# Patient Record
Sex: Male | Born: 1975 | Race: White | Hispanic: No | Marital: Married | Smoking: Never smoker
Health system: Southern US, Community
[De-identification: ages and names within clinical notes are randomized; demographics above are authoritative.]

## PROBLEM LIST (undated history)

## (undated) DIAGNOSIS — R0789 Other chest pain: Secondary | ICD-10-CM

## (undated) DIAGNOSIS — R221 Localized swelling, mass and lump, neck: Secondary | ICD-10-CM

## (undated) DIAGNOSIS — N200 Calculus of kidney: Secondary | ICD-10-CM

## (undated) HISTORY — PX: KNEE ARTHROCENTESIS: SUR44

## (undated) HISTORY — PX: KNEE SURGERY: SHX244

## (undated) HISTORY — DX: Other chest pain: R07.89

## (undated) HISTORY — PX: HERNIA REPAIR: SHX51

## (undated) HISTORY — DX: Localized swelling, mass and lump, neck: R22.1

## (undated) HISTORY — DX: Calculus of kidney: N20.0

---

## 2004-08-12 ENCOUNTER — Encounter: Admission: RE | Admit: 2004-08-12 | Discharge: 2004-08-12 | Payer: Self-pay | Admitting: Family Medicine

## 2006-02-02 ENCOUNTER — Emergency Department (HOSPITAL_COMMUNITY): Admission: EM | Admit: 2006-02-02 | Discharge: 2006-02-03 | Payer: Self-pay | Admitting: Emergency Medicine

## 2008-06-07 ENCOUNTER — Ambulatory Visit: Payer: Self-pay | Admitting: Unknown Physician Specialty

## 2015-10-21 ENCOUNTER — Encounter: Payer: Self-pay | Admitting: Family Medicine

## 2015-10-21 ENCOUNTER — Ambulatory Visit
Admission: RE | Admit: 2015-10-21 | Discharge: 2015-10-21 | Disposition: A | Discharge status: Home / Self Care | Payer: BLUE CROSS/BLUE SHIELD | Source: Ambulatory Visit | Attending: Family Medicine | Admitting: Family Medicine

## 2015-10-21 ENCOUNTER — Ambulatory Visit (INDEPENDENT_AMBULATORY_CARE_PROVIDER_SITE_OTHER): Payer: BLUE CROSS/BLUE SHIELD | Admitting: Family Medicine

## 2015-10-21 VITALS — BP 116/78 | HR 74 | Temp 97.9°F | Resp 16 | Ht 74.0 in | Wt 211.2 lb

## 2015-10-21 DIAGNOSIS — R221 Localized swelling, mass and lump, neck: Secondary | ICD-10-CM | POA: Diagnosis not present

## 2015-10-21 DIAGNOSIS — R0789 Other chest pain: Secondary | ICD-10-CM | POA: Insufficient documentation

## 2015-10-21 DIAGNOSIS — K219 Gastro-esophageal reflux disease without esophagitis: Secondary | ICD-10-CM

## 2015-10-21 DIAGNOSIS — R09A2 Foreign body sensation, throat: Secondary | ICD-10-CM

## 2015-10-21 HISTORY — DX: Foreign body sensation, throat: R09.A2

## 2015-10-21 HISTORY — DX: Localized swelling, mass and lump, neck: R22.1

## 2015-10-21 HISTORY — DX: Other chest pain: R07.89

## 2015-10-21 MED ORDER — OMEPRAZOLE 20 MG PO CPDR: 20.0000 mg | DELAYED_RELEASE_CAPSULE | Freq: Every day | ORAL | Status: DC

## 2015-10-21 MED ORDER — ALBUTEROL SULFATE HFA 108 (90 BASE) MCG/ACT IN AERS: 2.0000 | INHALATION_SPRAY | Freq: Four times a day (QID) | RESPIRATORY_TRACT | Status: DC | PRN

## 2015-10-21 NOTE — Assessment & Plan Note (Signed)
Likely GERD induced bronchospasm or asthma type reaction. EKG is WNL. CXR is clear. Check CMET, Lipid, TSH.  Albuterol PRN. Alarm symptoms reviewed. Recheck in 2 weeks.

## 2015-10-21 NOTE — Patient Instructions (Signed)
I think your chest tightness is caused by a mixture of acid reflux and fumes at work. We will get a chest XR today to ensure nothing is causing your symptoms. Start an albuterol inhaler as needed for chest tightness and start taking omeprazole once daily to help with your symptoms.   Please seek immediate medical attention if you develop shortness of breath not relieve by inhaler, chest pain/tightness, fever > 103 F or other concerning symptoms.

## 2015-10-21 NOTE — Assessment & Plan Note (Signed)
Likely silent GERD. Trial of omeprazole once daily. Alarm symptoms reviewed. Consider ENT referral if not improving. Recheck 2 weeks.

## 2015-10-21 NOTE — Progress Notes (Signed)
Subjective:    Patient ID: Devon Lewis, male    DOB: 1975-11-09, 40 y.o.   MRN: [id number]  HPI: Devon Lewis is a 40 y.o. male presenting on 10/21/2015 for Adenopathy   HPI  Pt presents as new patient today for acute visit. Pt reporting chest tightness and lump sensation in the throat x1 week. Symptoms started last Monday. Sensation of lump in the throat. Chest tightness started on Thursday. No trouble breathing. Chest tightness is intermittent. The more active he is the worse the chest tightness occurs. Pt reports tightness feels like chest congestions but when he coughs to get it up nothing changes. No HA, no fevers. No recent illness. Pt is not a smoker. 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  Home treatment: Zyrtec did not help.   Previous provider [facility]. Records will be requested and reviewed.  No previous medical history.   No past medical history on file. Social History   Social History  . Marital Status: Married    Spouse Name: N/A  . Number of Children: N/A  . Years of Education: N/A   Occupational History  . Not on file.   Social History Main Topics  . Smoking status: Never Smoker   . Smokeless tobacco: Not on file  . Alcohol Use: No  . Drug Use: No  . Sexual Activity: Not on file   Other Topics Concern  . Not on file   Social History Narrative  . No narrative on file   No family history on file. No current outpatient prescriptions on file prior to visit.   No current facility-administered medications on file prior to visit.    Review of Systems  Constitutional: Negative for fever and chills.  HENT: Positive for trouble swallowing (sensation of lump in throat). Negative for congestion, ear discharge, facial swelling, postnasal drip and sinus pressure.   Respiratory: Positive for chest tightness. Negative for cough.   Cardiovascular: Negative for chest pain.  Gastrointestinal: Negative for nausea, vomiting, abdominal pain and  abdominal distention.  Musculoskeletal: Negative for myalgias, arthralgias, neck pain and neck stiffness.  Neurological: Negative for dizziness, numbness and headaches.   Per HPI unless specifically indicated above     Objective:    BP 116/78 mmHg  Pulse 74  Temp(Src) 97.9 F (36.6 C) (Oral)  Resp 16  Ht  (1.88 m)  Wt 211 lb 3.2 oz (95.8 kg)  BMI 27.11 kg/m2  Wt Readings from Last 3 Encounters:  10/21/15 211 lb 3.2 oz (95.8 kg)    Physical Exam  Constitutional: He is oriented to person, place, and time. He appears well-developed and well-nourished. No distress.  HENT:  Head: Normocephalic and atraumatic.  Right Ear: Hearing and tympanic membrane normal.  Left Ear: Hearing and tympanic membrane normal.  Nose: No mucosal edema or rhinorrhea. Right sinus exhibits no maxillary sinus tenderness and no frontal sinus tenderness. Left sinus exhibits no maxillary sinus tenderness and no frontal sinus tenderness.  Mouth/Throat: No uvula swelling. No posterior oropharyngeal edema or posterior oropharyngeal erythema.  Neck: Neck supple. No thyromegaly present.  Cardiovascular: Normal rate, regular rhythm and normal heart sounds.  Exam reveals no gallop and no friction rub.   No murmur heard. Pulmonary/Chest: Effort normal and breath sounds normal. He has no wheezes.  Abdominal: Soft. Bowel sounds are normal. He exhibits no distension. There is no tenderness. There is no rebound.  Musculoskeletal: Normal range of motion. He exhibits no edema or tenderness.  Neurological:  He is alert and oriented to person, place, and time. He has normal reflexes.  Skin: Skin is warm and dry. No rash noted. No erythema.  Psychiatric: He has a normal mood and affect. His behavior is normal. Thought content normal.   No results found for this or any previous visit.    Assessment & Plan:   Problem List Items Addressed This Visit      Other   Chest tightness - Primary    Likely GERD induced  bronchospasm or asthma type reaction. EKG is WNL. CXR is clear. Check CMET, Lipid, TSH.  Albuterol PRN. Alarm symptoms reviewed. Recheck in 2 weeks.       Relevant Medications   albuterol (PROVENTIL HFA;VENTOLIN HFA) 108 (90 Base) MCG/ACT inhaler   Other Relevant Orders   EKG 12-Lead   Lipid Profile   Comprehensive metabolic panel   TSH   DG Chest 2 View (Completed)   Sensation of lump in throat    Likely silent GERD. Trial of omeprazole once daily. Alarm symptoms reviewed. Consider ENT referral if not improving. Recheck 2 weeks.       Relevant Medications   omeprazole (PRILOSEC) 20 MG capsule   Other Relevant Orders   CBC with Differential/Platelet    Other Visit Diagnoses    Gastroesophageal reflux disease without esophagitis        Symptoms likely GERD related. Trial of PPI and recheck in 2 weeks.     Relevant Medications    omeprazole (PRILOSEC) 20 MG capsule    Other Relevant Orders    CBC with Differential/Platelet       Meds ordered this encounter  Medications  . albuterol (PROVENTIL HFA;VENTOLIN HFA) 108 (90 Base) MCG/ACT inhaler    Sig: Inhale 2 puffs into the lungs every 6 (six) hours as needed for wheezing or shortness of breath.    Dispense:  1 Inhaler    Refill:  0    Order Specific Question:  Supervising Provider    Answer:  Janeann Forehand [[id number]]  . omeprazole (PRILOSEC) 20 MG capsule    Sig: Take 1 capsule (20 mg total) by mouth daily.    Dispense:  30 capsule    Refill:  3    Order Specific Question:  Supervising Provider    Answer:  Janeann Forehand [[id number]]      Follow up plan: Return in about 2 weeks (around 11/04/2015).

## 2015-10-31 LAB — CBC WITH DIFFERENTIAL/PLATELET
Basophils Absolute: 0 10*3/uL (ref 0.0–0.2)
Basos: 1 %
EOS (ABSOLUTE): 0.2 10*3/uL (ref 0.0–0.4)
Eos: 2 %
Hematocrit: 46 % (ref 37.5–51.0)
Hemoglobin: 15.8 g/dL (ref 12.6–17.7)
Immature Grans (Abs): 0 10*3/uL (ref 0.0–0.1)
Immature Granulocytes: 0 %
Lymphocytes Absolute: 2.4 10*3/uL (ref 0.7–3.1)
Lymphs: 33 %
MCH: 29.9 pg (ref 26.6–33.0)
MCHC: 34.3 g/dL (ref 31.5–35.7)
MCV: 87 fL (ref 79–97)
Monocytes Absolute: 0.5 10*3/uL (ref 0.1–0.9)
Monocytes: 7 %
Neutrophils Absolute: 4.3 10*3/uL (ref 1.4–7.0)
Neutrophils: 57 %
Platelets: 254 10*3/uL (ref 150–379)
RBC: 5.28 x10E6/uL (ref 4.14–5.80)
RDW: 13 % (ref 12.3–15.4)
WBC: 7.4 10*3/uL (ref 3.4–10.8)

## 2015-10-31 LAB — COMPREHENSIVE METABOLIC PANEL
ALT: 18 IU/L (ref 0–44)
AST: 21 IU/L (ref 0–40)
Albumin/Globulin Ratio: 1.8 (ref 1.1–2.5)
Albumin: 4.6 g/dL (ref 3.5–5.5)
Alkaline Phosphatase: 70 IU/L (ref 39–117)
BUN/Creatinine Ratio: 15 (ref 8–19)
BUN: 17 mg/dL (ref 6–20)
Bilirubin Total: 0.4 mg/dL (ref 0.0–1.2)
CO2: 25 mmol/L (ref 18–29)
CREATININE: 1.14 mg/dL (ref 0.76–1.27)
Calcium: 9.3 mg/dL (ref 8.7–10.2)
Chloride: 100 mmol/L (ref 96–106)
GFR calc Af Amer: 93 mL/min/{1.73_m2} (ref >59)
GFR calc non Af Amer: 81 mL/min/{1.73_m2} (ref >59)
Globulin, Total: 2.5 g/dL (ref 1.5–4.5)
Glucose: 74 mg/dL (ref 65–99)
Potassium: 4.2 mmol/L (ref 3.5–5.2)
Sodium: 142 mmol/L (ref 134–144)
Total Protein: 7.1 g/dL (ref 6.0–8.5)

## 2015-10-31 LAB — LIPID PANEL
CHOL/HDL RATIO: 4.6 ratio (ref 0.0–5.0)
CHOLESTEROL TOTAL: 198 mg/dL (ref 100–199)
HDL: 43 mg/dL (ref >39)
LDL Calculated: 111 mg/dL — ABNORMAL HIGH (ref 0–99)
Triglycerides: 219 mg/dL — ABNORMAL HIGH (ref 0–149)
VLDL Cholesterol Cal: 44 mg/dL — ABNORMAL HIGH (ref 5–40)

## 2015-10-31 LAB — TSH: TSH: 2.21 u[IU]/mL (ref 0.450–4.500)

## 2015-11-05 ENCOUNTER — Ambulatory Visit (INDEPENDENT_AMBULATORY_CARE_PROVIDER_SITE_OTHER): Payer: BLUE CROSS/BLUE SHIELD | Admitting: Family Medicine

## 2015-11-05 ENCOUNTER — Encounter: Payer: Self-pay | Admitting: Family Medicine

## 2015-11-05 VITALS — BP 119/68 | HR 72 | Temp 98.7°F | Resp 16 | Ht 74.0 in | Wt 210.0 lb

## 2015-11-05 DIAGNOSIS — R0789 Other chest pain: Secondary | ICD-10-CM | POA: Diagnosis not present

## 2015-11-05 DIAGNOSIS — E7800 Pure hypercholesterolemia, unspecified: Secondary | ICD-10-CM | POA: Diagnosis not present

## 2015-11-05 DIAGNOSIS — R221 Localized swelling, mass and lump, neck: Secondary | ICD-10-CM

## 2015-11-05 DIAGNOSIS — J40 Bronchitis, not specified as acute or chronic: Secondary | ICD-10-CM | POA: Diagnosis not present

## 2015-11-05 MED ORDER — DM-GUAIFENESIN ER 30-600 MG PO TB12: 1.0000 | ORAL_TABLET | Freq: Two times a day (BID) | ORAL | Status: DC

## 2015-11-05 MED ORDER — BENZONATATE 100 MG PO CAPS: 100.0000 mg | ORAL_CAPSULE | Freq: Three times a day (TID) | ORAL | Status: DC | PRN

## 2015-11-05 NOTE — Patient Instructions (Addendum)
Your symptoms are consistent with a viral upper respiratory infection. At this time there is no need for antibiotics.  If your symptoms persist for > 10 days or get better and than worsen please let me know. You may have a secondary bacterial infection.  You can use supportive care at home to help with your symptoms. I have sent Mucinex DM to your pharmacy to help break up the congestion and soothe your cough. You can takes this twice daily.  I have also sent tesslon perles to your pharmacy to help with the cough- you can take these 3 times daily as needed. Honey is a natural cough suppressant- so add it to your tea in the morning.  If you have a humidifer, set that up in your bedroom at night.   Please seek immediate medical attention if you develop shortness of breath not relieve by inhaler, chest pain/tightness, fever > 103 F or other concerning symptoms.   

## 2015-11-05 NOTE — Assessment & Plan Note (Signed)
Reviewed diet and lifestyle changes that can improve lipid profile. Recheck 6 mos.

## 2015-11-05 NOTE — Progress Notes (Signed)
Subjective:    Patient ID: Devon Lewis, male    DOB: Feb 14, 1976, 40 y.o.   MRN: [id number]  HPI: Devon Lewis is a 40 y.o. male presenting on 11/05/2015 for Chest Pain   HPI  Pt presents for follow-up of chest tightness. Symptoms had resolved with omeprazole and albuterol inhaler.  Doing well until his daughter gave him a URI starting 3 days ago.Pt is reporting scratchy throat and dry cough. Mild chest tightness that he thinks is related to the URI- relieved by inhaler. No shortness of breath.  No fevers.  No nasal congestion. Rhinorrhea. No ear pain.    No past medical history on file.  Current Outpatient Prescriptions on File Prior to Visit  Medication Sig  . albuterol (PROVENTIL HFA;VENTOLIN HFA) 108 (90 Base) MCG/ACT inhaler Inhale 2 puffs into the lungs every 6 (six) hours as needed for wheezing or shortness of breath.  Marland Kitchen omeprazole (PRILOSEC) 20 MG capsule Take 1 capsule (20 mg total) by mouth daily.   No current facility-administered medications on file prior to visit.    Review of Systems  Constitutional: Negative for fever and chills.  HENT: Positive for congestion, postnasal drip, rhinorrhea and sore throat. Negative for ear pain, sinus pressure, trouble swallowing and voice change.   Respiratory: Positive for cough and chest tightness. Negative for shortness of breath and wheezing.   Cardiovascular: Negative for chest pain, palpitations and leg swelling.  Gastrointestinal: Negative for nausea, vomiting and abdominal pain.  Musculoskeletal: Negative for neck pain and neck stiffness.   Per HPI unless specifically indicated above     Objective:    BP 119/68 mmHg  Pulse 72  Temp(Src) 98.7 F (37.1 C) (Oral)  Resp 16  Ht  (1.88 m)  Wt 210 lb (95.255 kg)  BMI 26.95 kg/m2  SpO2 96%  Wt Readings from Last 3 Encounters:  11/05/15 210 lb (95.255 kg)  10/21/15 211 lb 3.2 oz (95.8 kg)    Physical Exam  Constitutional: He appears well-developed and  well-nourished. No distress.  HENT:  Head: Normocephalic and atraumatic.  Right Ear: Hearing and tympanic membrane normal. Tympanic membrane is not erythematous and not bulging.  Left Ear: Hearing and tympanic membrane normal. Tympanic membrane is not erythematous and not bulging.  Nose: Mucosal edema and rhinorrhea present. No sinus tenderness or nasal septal hematoma. Right sinus exhibits no maxillary sinus tenderness and no frontal sinus tenderness. Left sinus exhibits no maxillary sinus tenderness and no frontal sinus tenderness.  Mouth/Throat: Uvula is midline and mucous membranes are normal. No uvula swelling. Posterior oropharyngeal erythema present. No posterior oropharyngeal edema.  Neck: Neck supple. No Brudzinski's sign and no Kernig's sign noted.  Cardiovascular: Normal rate, regular rhythm and normal heart sounds.   Pulmonary/Chest: Breath sounds normal. No accessory muscle usage. No tachypnea. No respiratory distress. He has no decreased breath sounds. He has no wheezes. He has no rhonchi. He has no rales. Chest wall is not dull to percussion. He exhibits no tenderness.  Lymphadenopathy:    He has no cervical adenopathy.   Results for orders placed or performed in visit on 10/21/15  Lipid Profile  Result Value Ref Range   Cholesterol, Total 198 100 - 199 mg/dL   Triglycerides 409 (H) 0 - 149 mg/dL   HDL 43 >81 mg/dL   VLDL Cholesterol Cal 44 (H) 5 - 40 mg/dL   LDL Calculated 191 (H) 0 - 99 mg/dL   Chol/HDL Ratio 4.6 0.0 - 5.0 ratio units  Comprehensive metabolic panel  Result Value Ref Range   Glucose 74 65 - 99 mg/dL   BUN 17 6 - 20 mg/dL   Creatinine, Ser 1.61 0.76 - 1.27 mg/dL   GFR calc non Af Amer 81 >59 mL/min/1.73   GFR calc Af Amer 93 >59 mL/min/1.73   BUN/Creatinine Ratio 15 8 - 19   Sodium 142 134 - 144 mmol/L   Potassium 4.2 3.5 - 5.2 mmol/L   Chloride 100 96 - 106 mmol/L   CO2 25 18 - 29 mmol/L   Calcium 9.3 8.7 - 10.2 mg/dL   Total Protein 7.1 6.0 - 8.5  g/dL   Albumin 4.6 3.5 - 5.5 g/dL   Globulin, Total 2.5 1.5 - 4.5 g/dL   Albumin/Globulin Ratio 1.8 1.1 - 2.5   Bilirubin Total 0.4 0.0 - 1.2 mg/dL   Alkaline Phosphatase 70 39 - 117 IU/L   AST 21 0 - 40 IU/L   ALT 18 0 - 44 IU/L  TSH  Result Value Ref Range   TSH 2.210 0.450 - 4.500 uIU/mL  CBC with Differential/Platelet  Result Value Ref Range   WBC 7.4 3.4 - 10.8 x10E3/uL   RBC 5.28 4.14 - 5.80 x10E6/uL   Hemoglobin 15.8 12.6 - 17.7 g/dL   Hematocrit 09.6 04.5 - 51.0 %   MCV 87 79 - 97 fL   MCH 29.9 26.6 - 33.0 pg   MCHC 34.3 31.5 - 35.7 g/dL   RDW 40.9 81.1 - 91.4 %   Platelets 254 150 - 379 x10E3/uL   Neutrophils 57 %   Lymphs 33 %   Monocytes 7 %   Eos 2 %   Basos 1 %   Neutrophils Absolute 4.3 1.4 - 7.0 x10E3/uL   Lymphocytes Absolute 2.4 0.7 - 3.1 x10E3/uL   Monocytes Absolute 0.5 0.1 - 0.9 x10E3/uL   EOS (ABSOLUTE) 0.2 0.0 - 0.4 x10E3/uL   Basophils Absolute 0.0 0.0 - 0.2 x10E3/uL   Immature Granulocytes 0 %   Immature Grans (Abs) 0.0 0.0 - 0.1 x10E3/uL      Assessment & Plan:   Problem List Items Addressed This Visit      Other   Chest tightness - Primary    Resolved completely until recent URI. Symptoms feel different. Likely 2/2 bronchitis. Encouraged continued use of inhaler. Alarm symptoms reviewed. Return if not improving.       Sensation of lump in throat    Resolved with omeprazole.       Mild hypercholesterolemia    Reviewed diet and lifestyle changes that can improve lipid profile. Recheck 6 mos.        Other Visit Diagnoses    Bronchitis        Likely 2/2 virus. Abx indication reviewed. Supportive care at home. Alarm symptoms reviewed. Return if not improving.     Relevant Medications    dextromethorphan-guaiFENesin (MUCINEX DM) 30-600 MG 12hr tablet    benzonatate (TESSALON) 100 MG capsule       Meds ordered this encounter  Medications  . dextromethorphan-guaiFENesin (MUCINEX DM) 30-600 MG 12hr tablet    Sig: Take 1 tablet by mouth  2 (two) times daily.    Dispense:  20 tablet    Refill:  0    Order Specific Question:  Supervising Provider    Answer:  Janeann Forehand [phone]  . benzonatate (TESSALON) 100 MG capsule    Sig: Take 1 capsule (100 mg total) by mouth 3 (three) times daily as needed.  Dispense:  30 capsule    Refill:  0    Order Specific Question:  Supervising Provider    Answer:  Arlis Porta [id number]      Follow up plan: Return if symptoms worsen or fail to improve.

## 2015-11-05 NOTE — Assessment & Plan Note (Signed)
Resolved with omeprazole.  °

## 2015-11-05 NOTE — Assessment & Plan Note (Signed)
Resolved completely until recent URI. Symptoms feel different. Likely 2/2 bronchitis. Encouraged continued use of inhaler. Alarm symptoms reviewed. Return if not improving.

## 2017-12-19 ENCOUNTER — Ambulatory Visit: Payer: 59 | Admitting: Nurse Practitioner

## 2017-12-19 ENCOUNTER — Encounter: Payer: Self-pay | Admitting: Nurse Practitioner

## 2017-12-19 ENCOUNTER — Other Ambulatory Visit: Payer: Self-pay

## 2017-12-19 VITALS — BP 121/73 | HR 71 | Temp 98.4°F | Ht 74.0 in | Wt 221.6 lb

## 2017-12-19 DIAGNOSIS — K648 Other hemorrhoids: Secondary | ICD-10-CM

## 2017-12-19 MED ORDER — HYDROCORTISONE ACETATE 25 MG RE SUPP: 25.0000 mg | Freq: Two times a day (BID) | RECTAL | 1 refills | Status: DC

## 2017-12-19 NOTE — Progress Notes (Signed)
Subjective:    Patient ID: Devon Lewis, male    DOB: 04/05/1976, 42 y.o.   MRN: [id number]  Devon Kirkshomas N [place]ovan[place] is a 42 y.o. male presenting on 12/19/2017 for Hemorrhoids (itching, irritation x 5 weeks. He's been treating it with OTC medicatin. Pt had treatment x 10 yrs ago. )   HPI Hemorrhoids Itching and irritation for last 5 weeks.  Pt has had treatment of hemorrhoids with injections over 10 years ago.  Since most recent flare, he has been using OTC Preparation H without relief.  - No enlargement, no bleeding, but has persistent itching and irritation that interrupts sleep. - Takes fiber supplement for prevention of constipation. He has soft, formed BM every 1-3 days.  Usually every 1-2 days.   - Pt notes itching and irritation symptoms come and go over the last 10 years.  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  Social History   Tobacco Use  . Smoking status: Never Smoker  . Smokeless tobacco: Never Used  Substance Use Topics  . Alcohol use: No  . Drug use: No    Review of Systems Per HPI unless specifically indicated above     Objective:    BP 121/73 (BP Location: Right Arm, Patient Position: Sitting, Cuff Size: Normal)   Pulse 71   Temp 98.4 F (36.9 C) (Oral)   Ht 6\' 2"  (1.88 m)   Wt 221 lb 9.6 oz (100.5 kg)   BMI 28.45 kg/m   Wt Readings from Last 3 Encounters:  12/19/17 221 lb 9.6 oz (100.5 kg)  11/05/15 210 lb (95.3 kg)  10/21/15 211 lb 3.2 oz (95.8 kg)    Physical Exam  Constitutional: He is oriented to person, place, and time. He appears well-developed and well-nourished. No distress.  HENT:  Head: Normocephalic and atraumatic.  Cardiovascular: Normal rate, regular rhythm, S1 normal, S2 normal, normal heart sounds and intact distal pulses.  Pulmonary/Chest: Effort normal and  breath sounds normal. No respiratory distress.  Abdominal: Soft. Bowel sounds are normal. He exhibits no distension. There is no hepatosplenomegaly. There is no tenderness. No hernia.  Genitourinary: Prostate normal. Rectal exam shows internal hemorrhoid (internal hemorrhoids noted at 12 and 3 o'clock - grade 3 without prolapse. not thrombosed.) and anal tone abnormal (hypertonic). Rectal exam shows no external hemorrhoid, no fissure, no mass and guaiac negative stool.  Neurological: He is alert and oriented to person, place, and time.  Skin: Skin is warm and dry.  Psychiatric: He has a normal mood and affect. His behavior is normal.  Vitals reviewed.    Results for orders placed or performed in visit on 10/21/15  Lipid Profile  Result Value Ref Range   Cholesterol, Total 198 100 - 199 mg/dL   Triglycerides 409219 (H) 0 - 149 mg/dL   HDL 43 >81>39 mg/dL   VLDL Cholesterol Cal 44 (H) 5 - 40 mg/dL   LDL Calculated 191111 (H) 0 - 99 mg/dL   Chol/HDL Ratio 4.6 0.0 - 5.0 ratio units  Comprehensive metabolic panel  Result Value Ref Range   Glucose 74 65 - 99 mg/dL   BUN 17 6 - 20 mg/dL   Creatinine, Ser 4.781.14 0.76 - 1.27 mg/dL   GFR calc non Af Amer 81 >59 mL/min/1.73   GFR calc Af Amer 93 >59 mL/min/1.73   BUN/Creatinine  Ratio 15 8 - 19   Sodium 142 134 - 144 mmol/L   Potassium 4.2 3.5 - 5.2 mmol/L   Chloride 100 96 - 106 mmol/L   CO2 25 18 - 29 mmol/L   Calcium 9.3 8.7 - 10.2 mg/dL   Total Protein 7.1 6.0 - 8.5 g/dL   Albumin 4.6 3.5 - 5.5 g/dL   Globulin, Total 2.5 1.5 - 4.5 g/dL   Albumin/Globulin Ratio 1.8 1.1 - 2.5   Bilirubin Total 0.4 0.0 - 1.2 mg/dL   Alkaline Phosphatase 70 39 - 117 IU/L   AST 21 0 - 40 IU/L   ALT 18 0 - 44 IU/L  TSH  Result Value Ref Range   TSH 2.210 0.450 - 4.500 uIU/mL  CBC with Differential/Platelet  Result Value Ref Range   WBC 7.4 3.4 - 10.8 x10E3/uL   RBC 5.28 4.14 - 5.80 x10E6/uL   Hemoglobin 15.8 12.6 - 17.7 g/dL   Hematocrit 16.1 09.6 - 51.0 %    MCV 87 79 - 97 fL   MCH 29.9 26.6 - 33.0 pg   MCHC 34.3 31.5 - 35.7 g/dL   RDW 04.5 40.9 - 81.1 %   Platelets 254 150 - 379 x10E3/uL   Neutrophils 57 %   Lymphs 33 %   Monocytes 7 %   Eos 2 %   Basos 1 %   Neutrophils Absolute 4.3 1.4 - 7.0 x10E3/uL   Lymphocytes Absolute 2.4 0.7 - 3.1 x10E3/uL   Monocytes Absolute 0.5 0.1 - 0.9 x10E3/uL   EOS (ABSOLUTE) 0.2 0.0 - 0.4 x10E3/uL   Basophils Absolute 0.0 0.0 - 0.2 x10E3/uL   Immature Granulocytes 0 %   Immature Grans (Abs) 0.0 0.0 - 0.1 x10E3/uL      Assessment & Plan:   Problem List Items Addressed This Visit    None    Visit Diagnoses    Internal hemorrhoids    -  Primary   Relevant Medications   hydrocortisone (ANUSOL-HC) 25 MG suppository   Other Relevant Orders   Ambulatory referral to Gastroenterology     Subacute inflamed internal hemorrhoids noted at 12 and 3 o'clock on DRE.  Irritation and itching are primary symptoms that are not relieved with conservative and OTC therapies.  Prior treatment of internal hemorrhoids has included steroid injection 10 years ago.   Plan: 1. Place suppository twice daily x 6 days. 2. Encouraged pt to practice pelvic floor relaxation exercises.  Consider pelvic floor physical therapy. 3. Referral to GI for consultation about possible internal hemorrhoid ligation. 4. Continue management of constipation with fiber supplement and drinking plenty of water. 5. Followup as needed in 2-4 weeks and for annual physical in next 6 months.   Meds ordered this encounter  Medications  . hydrocortisone (ANUSOL-HC) 25 MG suppository    Sig: Place 1 suppository (25 mg total) rectally 2 (two) times daily.    Dispense:  12 suppository    Refill:  1    Order Specific Question:   Supervising Provider    Answer:   Smitty Cords [[id number]]    Follow up plan: Return 2-4 weeks if symptoms worsen or fail to improve.  Wilhelmina Mcardle, DNP, AGPCNP-BC Adult Gerontology Primary Care Nurse  Practitioner [facility] 12/19/2017, 2:00 PM

## 2017-12-19 NOTE — Patient Instructions (Addendum)
Charleen Kirkshomas N [place],   Thank you for coming in to clinic today.  1. Work to relax pelvic floor tone with exercises below.  Think reverse of strengthening to stop urination or stop a bowel movement.  2. Place an anusol suppository twice daily for 6 days.  3. [facility] referral is placed.  You can cancel this if you don't need it.     Please schedule a follow-up appointment with Wilhelmina McardleLauren Huxley Shurley, AGNP. Return 2-4 weeks if symptoms worsen or fail to improve.  If you have any other questions or concerns, please feel free to call the clinic or send a message through MyChart. You may also schedule an earlier appointment if necessary.  You will receive a survey after today's visit either digitally by e-mail or paper by [employer] mail. Your experiences and feedback matter to us.  Please respond so we know how we are doing as we provide care for you.   Wilhelmina McardleLauren Annelyse Rey, DNP, AGNP-BC Adult Gerontology Nurse Practitioner [facility], [facility]  The Male Pelvic Floor Muscles  The pelvic floor consists of several layers of muscles that cover the bottom of the pelvic cavity. These muscles have several distinct roles:  1. To support the pelvic organs, the bladder and colon within the pelvis. 2. To assist in stopping and starting the flow of urine or the passage of gas or stool. 3. To aid in sexual appreciation.    How to Locate the Pelvic Floor Muscles  The Urine Stop Test . At the midstream of your urine flow, squeeze the pelvic floor muscles. You should feel the sensation of the openings close and the muscles pulling the penis and anus up and in to the pelvic cavity.  If you have strong muscles you will slow or stop the stream of urine. . Try to stop or slow the flow of urine without tensing the muscles of your legs, buttocks. . Do this only to locate the muscles, not as a daily exercise. Feeling the Muscle . Place a fingertip on or into the rectal opening.  Contract and lift the muscles as  though you were holding back gas or a bowel movement.   . You will feel your anal opening tighten and your penis move slightly. Watching the Muscles Contract . Begin by lying on a flat surface.  Position yourself with your knees apart and bent with your head elevated and supported on several pillows.  Use a mirror to look at the anal opening and penis.  . Contract or tighten the muscles around the anal opening and watch for a puckering and lifting of the anus and slight movement of the penis.   . If you see a bulge of your anus this is an incorrect contraction and you should notify your health care provider for more instructions.   2007, [employer] Doc.12

## 2017-12-30 ENCOUNTER — Ambulatory Visit: Payer: 59 | Admitting: Gastroenterology

## 2017-12-30 ENCOUNTER — Encounter: Payer: Self-pay | Admitting: Gastroenterology

## 2017-12-30 VITALS — BP 115/76 | HR 86 | Ht 74.0 in | Wt 223.4 lb

## 2017-12-30 DIAGNOSIS — K640 First degree hemorrhoids: Secondary | ICD-10-CM

## 2017-12-30 NOTE — Progress Notes (Signed)
Arlyss Repress, MD [address]  [address]  [place], [place] [address]  Main: [phone]  Fax: [phone]    Gastroenterology Consultation  Referring Provider:     Galen Manila, * Primary Care Physician:  Galen Manila, NP Primary Gastroenterologist:  Dr. Arlyss Repress Reason for Consultation:     Symptomatic hemorrhoids        HPI:   Devon Lewis is a 42 y.o. male referred by Dr. Kyung Rudd, Alison Stalling, NP  for consultation & management of symptomatic hemorrhoids. He has no past medical history, presents with long-term history of symptoms related to hemorrhoids including perianal itching and irritation. He reports that he had some injection to the hemorrhoids about 10 years ago which provided complete relief for 2 years. He has been having intermittent symptoms which are self-limiting. However, for the last 2 months he has been experiencing intense perianal itching and irritation. He used Anusol suppository for about 6 days which provided some relief. For the last 1 week, he has recurrence of the symptoms. He does use Preparation H which provides temporary relief. He denies swelling, burning, rectal discomfort, bleeding or prolapse. He denies constipation or diarrhea. He takes fiber gummy daily  NSAIDs: none  Antiplts/Anticoagulants/Anti thrombotics: none  GI Procedures: none He denies family history of GI malignancy  History reviewed. No pertinent past medical history.  Past Surgical History:  Procedure Laterality Date  . HERNIA REPAIR    . KNEE ARTHROCENTESIS      Prior to Admission medications   Medication Sig Start Date End Date Taking? Authorizing Provider  [facility] [employer] [facility] PO Take by mouth.   Yes [provider]  hydrocortisone (ANUSOL-HC) 25 MG suppository Place 1 suppository (25 mg total) rectally 2 (two) times daily. 12/19/17  Yes Galen Manila, NP    History reviewed. No pertinent family history.    Social History   Tobacco Use  . Smoking status: Never Smoker  . Smokeless tobacco: Never Used  Substance Use Topics  . Alcohol use: No  . Drug use: No    Allergies as of 12/30/2017  . (No Known Allergies)    Review of Systems:    All systems reviewed and negative except where noted in HPI.   Physical Exam:  BP 115/76   Pulse 86   Ht 6\' 2"  (1.88 m)   Wt 223 lb 6.4 oz (101.3 kg)   BMI 28.68 kg/m  No LMP for male patient.  General:   Alert,  Well-developed, well-nourished, pleasant and cooperative in NAD Head:  Normocephalic and atraumatic. Eyes:  Sclera clear, no icterus.   Conjunctiva pink. Ears:  Normal auditory acuity. Nose:  No deformity, discharge, or lesions. Mouth:  No deformity or lesions,oropharynx pink & moist. Neck:  Supple; no masses or thyromegaly. Lungs:  Respirations even and unlabored.  Clear throughout to auscultation.   No wheezes, crackles, or rhonchi. No acute distress. Heart:  Regular rate and rhythm; no murmurs, clicks, rubs, or gallops. Abdomen:  Normal bowel sounds. Soft, non-tender and non-distended without masses, hepatosplenomegaly or hernias noted.  No guarding or rebound tenderness.   Rectal: anal spasm, internal hemorrhoids. There were no perianal lesions or rash Msk:  Symmetrical without gross deformities. Good, equal movement & strength bilaterally. Pulses:  Normal pulses noted. Extremities:  No clubbing or edema.  No cyanosis. Neurologic:  Alert and oriented x3;  grossly normal neurologically. Skin:  Intact without significant lesions or rashes. No jaundice. Psych:  Alert and cooperative. Normal mood and affect.  Imaging Studies: CT in 06/2008 unremarkable  Assessment and Plan:   Charleen Kirkshomas N Rinkenberger is a 42 y.o. Caucasian male with chronic history of symptomatic hemorrhoids. Symptoms including perianal itching and irritation. I discussed with him in about outpatient hemorrhoid ligation and patient is agreeable for the procedure. Consent  obtained  RA hemorrhoid ligation was performed today   Follow up in 2 weeks   Arlyss Repressohini R Ashlay Altieri, MD

## 2018-01-12 ENCOUNTER — Ambulatory Visit: Payer: 59 | Admitting: Gastroenterology

## 2018-01-12 ENCOUNTER — Encounter: Payer: Self-pay | Admitting: Gastroenterology

## 2018-01-12 VITALS — BP 111/69 | HR 70 | Resp 16 | Ht 74.0 in | Wt 223.8 lb

## 2018-01-12 DIAGNOSIS — K640 First degree hemorrhoids: Secondary | ICD-10-CM

## 2018-01-12 NOTE — Progress Notes (Signed)
PROCEDURE NOTE: The patient presents with symptomatic grade 1 hemorrhoids, unresponsive to maximal medical therapy, requesting rubber band ligation of his/her hemorrhoidal disease.  All risks, benefits and alternative forms of therapy were described and informed consent was obtained.  The decision was made to band the RP internal hemorrhoid, and the [facility] O'Regan System was used to perform band ligation without complication.  Digital anorectal examination was then performed to assure proper positioning of the band, and to adjust the banded tissue as required.  The patient was discharged home without pain or other issues.  Dietary and behavioral recommendations were given and (if necessary - prescriptions were given), along with follow-up instructions.  The patient will return 2 weeks for follow-up and possible additional banding as required.  No complications were encountered and the patient tolerated the procedure well.  Mikeala Girdler R Shamell Suarez, MD [address]  [address]  [place], [place] [address]  Main: [phone]  Fax: [phone] Pager: [phone]  

## 2018-02-01 ENCOUNTER — Encounter: Payer: Self-pay | Admitting: Gastroenterology

## 2018-02-01 ENCOUNTER — Ambulatory Visit: Payer: 59 | Admitting: Gastroenterology

## 2018-02-01 VITALS — BP 108/71 | HR 71 | Resp 17 | Ht 74.0 in | Wt 220.8 lb

## 2018-02-01 DIAGNOSIS — K640 First degree hemorrhoids: Secondary | ICD-10-CM | POA: Diagnosis not present

## 2018-02-01 NOTE — Progress Notes (Signed)
PROCEDURE NOTE: The patient presents with symptomatic grade 1 hemorrhoids, unresponsive to maximal medical therapy, requesting rubber band ligation of his/her hemorrhoidal disease.  All risks, benefits and alternative forms of therapy were described and informed consent was obtained.    The decision was made to band the LL internal hemorrhoid, and the [facility] O'Regan System was used to perform band ligation without complication.  Digital anorectal examination was then performed to assure proper positioning of the band, and to adjust the banded tissue as required.  The patient was discharged home without pain or other issues.  Dietary and behavioral recommendations were given and (if necessary - prescriptions were given), along with follow-up instructions.  The patient will return 2 weeks for follow-up and possible additional banding as required.  No complications were encountered and the patient tolerated the procedure well.  Rohini R Vanga, MD [address]  [address]  [place], [place] [address]  Main: [phone]  Fax: [phone] Pager: [phone]    

## 2018-02-27 DIAGNOSIS — M9903 Segmental and somatic dysfunction of lumbar region: Secondary | ICD-10-CM | POA: Diagnosis not present

## 2018-02-27 DIAGNOSIS — M5033 Other cervical disc degeneration, cervicothoracic region: Secondary | ICD-10-CM | POA: Diagnosis not present

## 2018-02-27 DIAGNOSIS — M9901 Segmental and somatic dysfunction of cervical region: Secondary | ICD-10-CM | POA: Diagnosis not present

## 2018-02-28 ENCOUNTER — Ambulatory Visit: Payer: 59 | Admitting: Nurse Practitioner

## 2018-02-28 ENCOUNTER — Ambulatory Visit
Admission: RE | Admit: 2018-02-28 | Discharge: 2018-02-28 | Disposition: A | Discharge status: Home / Self Care | Payer: 59 | Source: Ambulatory Visit | Attending: Nurse Practitioner | Admitting: Nurse Practitioner

## 2018-02-28 ENCOUNTER — Encounter: Payer: Self-pay | Admitting: Nurse Practitioner

## 2018-02-28 ENCOUNTER — Other Ambulatory Visit: Payer: Self-pay

## 2018-02-28 VITALS — BP 114/66 | Temp 98.8°F | Ht 74.0 in | Wt 219.6 lb

## 2018-02-28 DIAGNOSIS — M79671 Pain in right foot: Secondary | ICD-10-CM

## 2018-02-28 MED ORDER — DICLOFENAC SODIUM 75 MG PO TBEC: 75.0000 mg | DELAYED_RELEASE_TABLET | Freq: Two times a day (BID) | ORAL | 0 refills | Status: AC

## 2018-02-28 NOTE — Patient Instructions (Addendum)
Charleen Kirkshomas N Rochford,   Thank you for coming in to clinic today.  1. You have foot pain, likely from overuse. - May consider changing to new shoes, be fitted for orthotics. Can make referral to podiatry if these  - Start taking Tylenol extra strength 1 to 2 tablets every 6-8 hours for aches or fever/chills for next few days as needed.  Do not take more than 3,000 mg in 24 hours from all medicines.   - START diclofenac 75 mg twice daily for 14 days. - AFTER diclofenac, may resume ibuprofen 400-600 mg three times daily as needed. - Use heat and ice.  Apply this for 15 minutes at a time 6-8 times per day.   - Muscle rub with lidocaine, lidocaine patch, Biofreeze, or tiger balm for topical pain relief.  Avoid using this with heat and ice to avoid burns.  Please schedule a follow-up appointment with Wilhelmina McardleLauren Friend Dorfman, AGNP. Call if symptoms worsen or fail to improve 2-3 weeks for podiatry referral.  If you have any other questions or concerns, please feel free to call the clinic or send a message through MyChart. You may also schedule an earlier appointment if necessary.  You will receive a survey after today's visit either digitally by e-mail or paper by [employer] mail. Your experiences and feedback matter to us.  Please respond so we know how we are doing as we provide care for you.   Wilhelmina McardleLauren Sloan Galentine, DNP, AGNP-BC Adult Gerontology Nurse Practitioner [facility], [facility]

## 2018-02-28 NOTE — Progress Notes (Signed)
Subjective:    Patient ID: Devon Lewis, male    DOB: 05/18/1976, 42 y.o.   MRN: [id number]  Devon Lewis is a 42 y.o. male presenting on 02/28/2018 for Foot Pain (persistent right foot pain x 2-3 weeks. Pain worsen w/ prolong standing)   HPI Foot Pain R foot x 2-3 weeks, has had onset of aching and occasional sharp pain in lateral midfoot of RIGHT foot.  Pain initially began later in day on Thursday.  Wrist pain also started at same time, but is improved.  Foot pain is gradually worsening and now is radiating from midfoot to heel with throbbing pain.  This pain is described as burning, throbbing, constant. - Had previously been intermittent 1-2 times per week, but always resolved with rest.   - If on feet all day, will also have shooting pain down into toes.  Update of interval history hemorrhoid: Initially improved with anusol and worsened as soon as he stopped using the suppositories. Pt kept GI appointment and is now s/p hemorrhoidal banding x 3.  No current pain, normal BM.  Pt reports is now fully resolved.  Social History   Tobacco Use  . Smoking status: Never Smoker  . Smokeless tobacco: Never Used  Substance Use Topics  . Alcohol use: No  . Drug use: No    Review of Systems Per HPI unless specifically indicated above     Objective:    BP 114/66 (BP Location: Right Arm, Patient Position: Sitting, Cuff Size: Normal)   Temp 98.8 F (37.1 C) (Oral)   Ht 6\' 2"  (1.88 m)   Wt 219 lb 9.6 oz (99.6 kg)   BMI 28.19 kg/m   Wt Readings from Last 3 Encounters:  02/28/18 219 lb 9.6 oz (99.6 kg)  02/01/18 220 lb 12.8 oz (100.2 kg)  01/12/18 223 lb 12.8 oz (101.5 kg)    Physical Exam  Constitutional: He is oriented to person, place, and time. He appears well-developed and well-nourished. No distress.  HENT:  Head: Normocephalic and atraumatic.  Cardiovascular: Normal rate, regular rhythm, S1 normal, S2 normal, normal heart sounds and intact distal pulses.  Pulmonary/Chest:  Effort normal and breath sounds normal. No respiratory distress.  Musculoskeletal:       Right foot: There is decreased range of motion (decreased flex/ext. Normal inversion/eversion) and tenderness (midfoot soft tissue and bony tenderness, mild tenderness of plantar fascia). There is no swelling, normal capillary refill, no crepitus, no deformity and no laceration.  Neurological: He is alert and oriented to person, place, and time.  Skin: Skin is warm and dry. Capillary refill takes less than 2 seconds.  Psychiatric: He has a normal mood and affect. His behavior is normal. Judgment and thought content normal.  Vitals reviewed.  Results for orders placed or performed in visit on 10/21/15  Lipid Profile  Result Value Ref Range   Cholesterol, Total 198 100 - 199 mg/dL   Triglycerides 409219 (H) 0 - 149 mg/dL   HDL 43 >81>39 mg/dL   VLDL Cholesterol Cal 44 (H) 5 - 40 mg/dL   LDL Calculated 191111 (H) 0 - 99 mg/dL   Chol/HDL Ratio 4.6 0.0 - 5.0 ratio units  Comprehensive metabolic panel  Result Value Ref Range   Glucose 74 65 - 99 mg/dL   BUN 17 6 - 20 mg/dL   Creatinine, Ser 4.781.14 0.76 - 1.27 mg/dL   GFR calc non Af Amer 81 >59 mL/min/1.73   GFR calc Af Amer 93 >59 mL/min/1.73  BUN/Creatinine Ratio 15 8 - 19   Sodium 142 134 - 144 mmol/L   Potassium 4.2 3.5 - 5.2 mmol/L   Chloride 100 96 - 106 mmol/L   CO2 25 18 - 29 mmol/L   Calcium 9.3 8.7 - 10.2 mg/dL   Total Protein 7.1 6.0 - 8.5 g/dL   Albumin 4.6 3.5 - 5.5 g/dL   Globulin, Total 2.5 1.5 - 4.5 g/dL   Albumin/Globulin Ratio 1.8 1.1 - 2.5   Bilirubin Total 0.4 0.0 - 1.2 mg/dL   Alkaline Phosphatase 70 39 - 117 IU/L   AST 21 0 - 40 IU/L   ALT 18 0 - 44 IU/L  TSH  Result Value Ref Range   TSH 2.210 0.450 - 4.500 uIU/mL  CBC with Differential/Platelet  Result Value Ref Range   WBC 7.4 3.4 - 10.8 x10E3/uL   RBC 5.28 4.14 - 5.80 x10E6/uL   Hemoglobin 15.8 12.6 - 17.7 g/dL   Hematocrit 16.1 09.6 - 51.0 %   MCV 87 79 - 97 fL   MCH  29.9 26.6 - 33.0 pg   MCHC 34.3 31.5 - 35.7 g/dL   RDW 04.5 40.9 - 81.1 %   Platelets 254 150 - 379 x10E3/uL   Neutrophils 57 %   Lymphs 33 %   Monocytes 7 %   Eos 2 %   Basos 1 %   Neutrophils Absolute 4.3 1.4 - 7.0 x10E3/uL   Lymphocytes Absolute 2.4 0.7 - 3.1 x10E3/uL   Monocytes Absolute 0.5 0.1 - 0.9 x10E3/uL   EOS (ABSOLUTE) 0.2 0.0 - 0.4 x10E3/uL   Basophils Absolute 0.0 0.0 - 0.2 x10E3/uL   Immature Granulocytes 0 %   Immature Grans (Abs) 0.0 0.0 - 0.1 x10E3/uL      Assessment & Plan:   Problem List Items Addressed This Visit    None    Visit Diagnoses    Right foot pain    -  Primary   Relevant Orders   DG Foot Complete Right (Completed)    Pain likely self-limited, but cannot exclude stress fracture or plantar fasciitis.  Plan:  1. Treat with pain meds acetaminophen and diclofenac.  Take diclofenac 75 mg bid x 14 days. Discussed alternate dosing and max dosing.  May use ibuprofen after diclofenac. 2. Apply heat and/or ice to affected area. 3. May also apply a muscle rub with lidocaine or lidocaine patch after heat or ice. 4. Xray foot today. 5. Follow up 2-3 weeks prn. May need podiatry referral if not improving.    Meds ordered this encounter  Medications  . diclofenac (VOLTAREN) 75 MG EC tablet    Sig: Take 1 tablet (75 mg total) by mouth 2 (two) times daily for 14 days.    Dispense:  28 tablet    Refill:  0    Order Specific Question:   Supervising Provider    Answer:   Smitty Cords [[id number]]    Follow up plan: Return if symptoms worsen or fail to improve 2-3 weeks for podiatry referral.  Wilhelmina Mcardle, DNP, AGPCNP-BC Adult Gerontology Primary Care Nurse Practitioner [facility] 02/28/2018, 1:58 PM

## 2018-03-01 ENCOUNTER — Encounter: Payer: Self-pay | Admitting: Nurse Practitioner

## 2018-03-02 DIAGNOSIS — M5033 Other cervical disc degeneration, cervicothoracic region: Secondary | ICD-10-CM | POA: Diagnosis not present

## 2018-03-02 DIAGNOSIS — M9901 Segmental and somatic dysfunction of cervical region: Secondary | ICD-10-CM | POA: Diagnosis not present

## 2018-03-02 DIAGNOSIS — M9903 Segmental and somatic dysfunction of lumbar region: Secondary | ICD-10-CM | POA: Diagnosis not present

## 2018-03-14 DIAGNOSIS — M9903 Segmental and somatic dysfunction of lumbar region: Secondary | ICD-10-CM | POA: Diagnosis not present

## 2018-03-14 DIAGNOSIS — M5033 Other cervical disc degeneration, cervicothoracic region: Secondary | ICD-10-CM | POA: Diagnosis not present

## 2018-03-14 DIAGNOSIS — M9901 Segmental and somatic dysfunction of cervical region: Secondary | ICD-10-CM | POA: Diagnosis not present

## 2018-03-21 DIAGNOSIS — M5033 Other cervical disc degeneration, cervicothoracic region: Secondary | ICD-10-CM | POA: Diagnosis not present

## 2018-03-21 DIAGNOSIS — M9903 Segmental and somatic dysfunction of lumbar region: Secondary | ICD-10-CM | POA: Diagnosis not present

## 2018-03-21 DIAGNOSIS — M9901 Segmental and somatic dysfunction of cervical region: Secondary | ICD-10-CM | POA: Diagnosis not present

## 2018-04-04 DIAGNOSIS — M9903 Segmental and somatic dysfunction of lumbar region: Secondary | ICD-10-CM | POA: Diagnosis not present

## 2018-04-04 DIAGNOSIS — M5033 Other cervical disc degeneration, cervicothoracic region: Secondary | ICD-10-CM | POA: Diagnosis not present

## 2018-04-04 DIAGNOSIS — M9901 Segmental and somatic dysfunction of cervical region: Secondary | ICD-10-CM | POA: Diagnosis not present

## 2018-04-18 DIAGNOSIS — M9901 Segmental and somatic dysfunction of cervical region: Secondary | ICD-10-CM | POA: Diagnosis not present

## 2018-04-18 DIAGNOSIS — M9903 Segmental and somatic dysfunction of lumbar region: Secondary | ICD-10-CM | POA: Diagnosis not present

## 2018-04-18 DIAGNOSIS — M5033 Other cervical disc degeneration, cervicothoracic region: Secondary | ICD-10-CM | POA: Diagnosis not present

## 2018-05-02 DIAGNOSIS — M5033 Other cervical disc degeneration, cervicothoracic region: Secondary | ICD-10-CM | POA: Diagnosis not present

## 2018-05-02 DIAGNOSIS — M9901 Segmental and somatic dysfunction of cervical region: Secondary | ICD-10-CM | POA: Diagnosis not present

## 2018-05-02 DIAGNOSIS — M9903 Segmental and somatic dysfunction of lumbar region: Secondary | ICD-10-CM | POA: Diagnosis not present

## 2018-05-16 DIAGNOSIS — M9903 Segmental and somatic dysfunction of lumbar region: Secondary | ICD-10-CM | POA: Diagnosis not present

## 2018-05-16 DIAGNOSIS — M5033 Other cervical disc degeneration, cervicothoracic region: Secondary | ICD-10-CM | POA: Diagnosis not present

## 2018-05-16 DIAGNOSIS — M9901 Segmental and somatic dysfunction of cervical region: Secondary | ICD-10-CM | POA: Diagnosis not present

## 2018-05-24 ENCOUNTER — Ambulatory Visit: Payer: 59 | Admitting: Nurse Practitioner

## 2018-05-24 ENCOUNTER — Encounter: Payer: Self-pay | Admitting: Nurse Practitioner

## 2018-05-24 ENCOUNTER — Other Ambulatory Visit: Payer: Self-pay

## 2018-05-24 VITALS — BP 116/66 | HR 69 | Temp 98.0°F | Ht 74.0 in | Wt 220.6 lb

## 2018-05-24 DIAGNOSIS — M792 Neuralgia and neuritis, unspecified: Secondary | ICD-10-CM | POA: Diagnosis not present

## 2018-05-24 MED ORDER — GABAPENTIN 100 MG PO CAPS: 100.0000 mg | ORAL_CAPSULE | Freq: Three times a day (TID) | ORAL | 0 refills | Status: DC | PRN

## 2018-05-24 MED ORDER — PREDNISONE 10 MG PO TABS
ORAL_TABLET | ORAL | 0 refills | Status: DC
Start: 2018-05-24 — End: 2018-09-27

## 2018-05-24 NOTE — Patient Instructions (Addendum)
Devon Lewis,   Thank you for coming in to clinic today.  1. You are having a nerve related pain behind your RIGHT ear.  - Start prednisone 6 day taper. Take prednisone taper 10 mg tablets Day 1 (Today): Take 6 pills at one time Day 2: Take 5 pills Day 3: Take 4 pills Day 4: Take 3 pills Day 5: Take 2 pills Day 6: Take 1 pill then stop.  2. Start gabapentin for your burning pain. - Take 100 mg at bedtime.  May also take during the day, but be careful.  It can cause sleepiness.  Please schedule a follow-up appointment with [name], AGNP. Return 2-4 days if symptoms worsen or fail to improve.  If you have any other questions or concerns, please feel free to call the clinic or send a message through MyChart. You may also schedule an earlier appointment if necessary.  You will receive a survey after today's visit either digitally by e-mail or paper by [employer] mail. Your experiences and feedback matter to us.  Please respond so we know how we are doing as we provide care for you.   Wilhelmina McardleLauren Daekwon Beswick, DNP, AGNP-BC Adult Gerontology Nurse Practitioner [facility], [facility]

## 2018-05-24 NOTE — Progress Notes (Signed)
Subjective:    Patient ID: Devon Lewis, male    DOB: 02/16/1976, 42 y.o.   MRN: [id number]  Devon Lewis is a 42 y.o. male presenting on 05/24/2018 for Ear Pain (intermittent throbbing, burning sensation behind the right ear x 3 weeks)   HPI Ear Pain Monday felt it was traveling lower and downward.  Is behind ear on skull.  Is worse in mornings.  Pain is throbbing and burning.  Once he gets busy, pain is ignored.  No improvement over last 3-4 weeks.  Does not occur daily.  Yesterday had mild headache through the day.  Mildly tender to touch.  No change in hearing except one day last week with tinnitus.  Denies tenderness with touching of hair, denies any new headaches or severe headaches, denies changes in vision, and  denies changes in taste.  Social History   Tobacco Use  . Smoking status: Never Smoker  . Smokeless tobacco: Never Used  Substance Use Topics  . Alcohol use: No  . Drug use: No    Review of Systems  Constitutional: Negative for activity change, appetite change, chills and fever.  HENT: Positive for ear pain (Postauricular ear pain). Negative for congestion, dental problem, drooling, ear discharge, facial swelling, hearing loss, mouth sores, nosebleeds, postnasal drip, rhinorrhea, sinus pressure, sinus pain, sneezing, sore throat, tinnitus, trouble swallowing and voice change.   Eyes: Negative for photophobia, pain, discharge, redness, itching and visual disturbance.  Respiratory: Negative.   Cardiovascular: Negative.   Neurological: Negative for dizziness, tremors, seizures, syncope, facial asymmetry, speech difficulty, weakness, light-headedness, numbness and headaches.   Per HPI unless specifically indicated above     Objective:    BP 116/66 (BP Location: Left Arm, Patient Position: Sitting, Cuff Size: Normal)   Pulse 69   Temp 98 F (36.7 C) (Oral)   Ht 6\' 2"  (1.88 m)   Wt 220 lb 9.6 oz (100.1 kg)   BMI 28.32 kg/m   Wt Readings from Last 3 Encounters:    05/24/18 220 lb 9.6 oz (100.1 kg)  02/28/18 219 lb 9.6 oz (99.6 kg)  02/01/18 220 lb 12.8 oz (100.2 kg)    Physical Exam  Constitutional: He is oriented to person, place, and time. He appears well-developed and well-nourished. No distress.  HENT:  Head: Normocephalic and atraumatic.  Right Ear: Hearing, tympanic membrane, external ear and ear canal normal. No drainage, swelling or tenderness. No mastoid tenderness. Tympanic membrane is not erythematous. No middle ear effusion. No decreased hearing is noted.  Left Ear: Hearing, tympanic membrane, external ear and ear canal normal. No drainage, swelling or tenderness. No mastoid tenderness. Tympanic membrane is not erythematous.  No middle ear effusion. No decreased hearing is noted.  Nose: Nose normal. Right sinus exhibits no maxillary sinus tenderness and no frontal sinus tenderness. Left sinus exhibits no maxillary sinus tenderness and no frontal sinus tenderness.  Mouth/Throat: Uvula is midline, oropharynx is clear and moist and mucous membranes are normal. Tonsils are 0 on the right. Tonsils are 0 on the left.  2 sebaceous cysts located in postauricular area.  No other skin lesions noted.  Eyes: Pupils are equal, round, and reactive to light. Conjunctivae, EOM and lids are normal. Lids are everted and swept, no foreign bodies found.  Neck: Normal range of motion. Neck supple.  Lymphadenopathy:    He has no cervical adenopathy.  Neurological: He is alert and oriented to person, place, and time. He has normal strength and normal reflexes. No  cranial nerve deficit or sensory deficit. He displays a negative Romberg sign. Gait normal.  Skin: Skin is warm and dry.  Psychiatric: He has a normal mood and affect. His behavior is normal.  Vitals reviewed.  Results for orders placed or performed in visit on 10/21/15  Lipid Profile  Result Value Ref Range   Cholesterol, Total 198 100 - 199 mg/dL   Triglycerides 595 (H) 0 - 149 mg/dL   HDL 43 >63  mg/dL   VLDL Cholesterol Cal 44 (H) 5 - 40 mg/dL   LDL Calculated 875 (H) 0 - 99 mg/dL   Chol/HDL Ratio 4.6 0.0 - 5.0 ratio units  Comprehensive metabolic panel  Result Value Ref Range   Glucose 74 65 - 99 mg/dL   BUN 17 6 - 20 mg/dL   Creatinine, Ser 6.43 0.76 - 1.27 mg/dL   GFR calc non Af Amer 81 >59 mL/min/1.73   GFR calc Af Amer 93 >59 mL/min/1.73   BUN/Creatinine Ratio 15 8 - 19   Sodium 142 134 - 144 mmol/L   Potassium 4.2 3.5 - 5.2 mmol/L   Chloride 100 96 - 106 mmol/L   CO2 25 18 - 29 mmol/L   Calcium 9.3 8.7 - 10.2 mg/dL   Total Protein 7.1 6.0 - 8.5 g/dL   Albumin 4.6 3.5 - 5.5 g/dL   Globulin, Total 2.5 1.5 - 4.5 g/dL   Albumin/Globulin Ratio 1.8 1.1 - 2.5   Bilirubin Total 0.4 0.0 - 1.2 mg/dL   Alkaline Phosphatase 70 39 - 117 IU/L   AST 21 0 - 40 IU/L   ALT 18 0 - 44 IU/L  TSH  Result Value Ref Range   TSH 2.210 0.450 - 4.500 uIU/mL  CBC with Differential/Platelet  Result Value Ref Range   WBC 7.4 3.4 - 10.8 x10E3/uL   RBC 5.28 4.14 - 5.80 x10E6/uL   Hemoglobin 15.8 12.6 - 17.7 g/dL   Hematocrit 32.9 51.8 - 51.0 %   MCV 87 79 - 97 fL   MCH 29.9 26.6 - 33.0 pg   MCHC 34.3 31.5 - 35.7 g/dL   RDW 84.1 66.0 - 63.0 %   Platelets 254 150 - 379 x10E3/uL   Neutrophils 57 %   Lymphs 33 %   Monocytes 7 %   Eos 2 %   Basos 1 %   Neutrophils Absolute 4.3 1.4 - 7.0 x10E3/uL   Lymphocytes Absolute 2.4 0.7 - 3.1 x10E3/uL   Monocytes Absolute 0.5 0.1 - 0.9 x10E3/uL   EOS (ABSOLUTE) 0.2 0.0 - 0.4 x10E3/uL   Basophils Absolute 0.0 0.0 - 0.2 x10E3/uL   Immature Granulocytes 0 %   Immature Grans (Abs) 0.0 0.0 - 0.1 x10E3/uL      Assessment & Plan:   Problem List Items Addressed This Visit    None    Visit Diagnoses    Neuropathic pain    -  Primary   Relevant Medications   predniSONE (DELTASONE) 10 MG tablet   gabapentin (NEURONTIN) 100 MG capsule    Patient presents with subacute postauricular neuropathic pain.  Pain is worse in a.m. and is not affecting any  of patient's sensory functions.  Neuro exam and ear exams are normal.  Diagnoses considered include: Shingles, mastoiditis, acute otitis media, temporal arteritis, trigeminal neuralgia.  No diagnoses are 100% consistent with symptoms.  Plan: 1. Start prednisone taper over 6 days Day 1: 60 mg, Day 2: 50 mg, Day 3: 40 mg; Day 4: 30 mg; Day 5 20  mg; Day 6: 10 mg then stop. 2. Start gabapentin 100 mg at bedtime.  May increase to tid prn if neuropathic pain occurs during day.  Cautioned drowsiness. 3. Followup prn 2-4 weeks. Reviewed emergency signs and symptoms.  May benefit from ENT referral in future if symptoms are not improved.  Meds ordered this encounter  Medications  . predniSONE (DELTASONE) 10 MG tablet    Sig: Day 1 take 6 pills. Day 2 take 5 pills then reduce by 1 pill each day.    Dispense:  21 tablet    Refill:  0    Order Specific Question:   Supervising Provider    Answer:   Smitty Cords [[id number]]  . gabapentin (NEURONTIN) 100 MG capsule    Sig: Take 1 capsule (100 mg total) by mouth 3 (three) times daily as needed for up to 15 days.    Dispense:  45 capsule    Refill:  0    Order Specific Question:   Supervising Provider    Answer:   Smitty Cords [[id number]]    Follow up plan: Return 2-4 days if symptoms worsen or fail to improve.  Wilhelmina Mcardle, DNP, AGPCNP-BC Adult Gerontology Primary Care Nurse Practitioner [facility] 05/24/2018, 9:03 AM

## 2018-05-30 ENCOUNTER — Encounter: Payer: Self-pay | Admitting: Nurse Practitioner

## 2018-06-02 ENCOUNTER — Telehealth: Payer: Self-pay

## 2018-06-02 DIAGNOSIS — M792 Neuralgia and neuritis, unspecified: Secondary | ICD-10-CM

## 2018-06-02 MED ORDER — GABAPENTIN 100 MG PO CAPS: ORAL_CAPSULE | ORAL | 0 refills | Status: DC

## 2018-06-02 NOTE — Telephone Encounter (Signed)
Gabapentin helped some for limited time.  Yesterday had dizziness during a haircut.  Resolved by today.  Continues to have normal hearing.  - Referral to ENT. - Increase gabapentin to 300 mg at bedtime, 100 mg in am and afternoon.

## 2018-06-02 NOTE — Telephone Encounter (Signed)
Patient called reporting that he is still have pain behind right ear and sypmptons are no better since he was seen.  Patient said it was discussed about an referral. Please advise

## 2018-06-13 DIAGNOSIS — M5416 Radiculopathy, lumbar region: Secondary | ICD-10-CM | POA: Diagnosis not present

## 2018-06-13 DIAGNOSIS — M9903 Segmental and somatic dysfunction of lumbar region: Secondary | ICD-10-CM | POA: Diagnosis not present

## 2018-06-13 DIAGNOSIS — M9904 Segmental and somatic dysfunction of sacral region: Secondary | ICD-10-CM | POA: Diagnosis not present

## 2018-06-15 DIAGNOSIS — M792 Neuralgia and neuritis, unspecified: Secondary | ICD-10-CM | POA: Diagnosis not present

## 2018-06-15 DIAGNOSIS — H9201 Otalgia, right ear: Secondary | ICD-10-CM | POA: Diagnosis not present

## 2018-06-28 ENCOUNTER — Other Ambulatory Visit: Payer: Self-pay | Admitting: Nurse Practitioner

## 2018-06-28 DIAGNOSIS — M792 Neuralgia and neuritis, unspecified: Secondary | ICD-10-CM

## 2018-07-11 DIAGNOSIS — M9904 Segmental and somatic dysfunction of sacral region: Secondary | ICD-10-CM | POA: Diagnosis not present

## 2018-07-11 DIAGNOSIS — M9903 Segmental and somatic dysfunction of lumbar region: Secondary | ICD-10-CM | POA: Diagnosis not present

## 2018-07-11 DIAGNOSIS — M5416 Radiculopathy, lumbar region: Secondary | ICD-10-CM | POA: Diagnosis not present

## 2018-08-15 DIAGNOSIS — M5416 Radiculopathy, lumbar region: Secondary | ICD-10-CM | POA: Diagnosis not present

## 2018-08-15 DIAGNOSIS — M9903 Segmental and somatic dysfunction of lumbar region: Secondary | ICD-10-CM | POA: Diagnosis not present

## 2018-08-15 DIAGNOSIS — M9904 Segmental and somatic dysfunction of sacral region: Secondary | ICD-10-CM | POA: Diagnosis not present

## 2018-09-12 DIAGNOSIS — M9903 Segmental and somatic dysfunction of lumbar region: Secondary | ICD-10-CM | POA: Diagnosis not present

## 2018-09-12 DIAGNOSIS — M9904 Segmental and somatic dysfunction of sacral region: Secondary | ICD-10-CM | POA: Diagnosis not present

## 2018-09-12 DIAGNOSIS — M5416 Radiculopathy, lumbar region: Secondary | ICD-10-CM | POA: Diagnosis not present

## 2018-09-27 ENCOUNTER — Ambulatory Visit: Payer: 59 | Admitting: Gastroenterology

## 2018-09-27 ENCOUNTER — Other Ambulatory Visit: Payer: Self-pay

## 2018-09-27 ENCOUNTER — Encounter: Payer: Self-pay | Admitting: Gastroenterology

## 2018-09-27 ENCOUNTER — Ambulatory Visit (INDEPENDENT_AMBULATORY_CARE_PROVIDER_SITE_OTHER): Payer: 59 | Admitting: Gastroenterology

## 2018-09-27 VITALS — BP 128/80 | HR 76 | Resp 17 | Ht 74.0 in | Wt 224.0 lb

## 2018-09-27 DIAGNOSIS — K640 First degree hemorrhoids: Secondary | ICD-10-CM | POA: Diagnosis not present

## 2018-09-27 NOTE — Progress Notes (Signed)
Arlyss Repress, MD [address]  [address]  [place], [place] [address]  Main: [phone]  Fax: [phone]    Gastroenterology Consultation  Referring Provider:     Galen Manila, * Primary Care Physician:  Galen Manila, NP Primary Gastroenterologist:  Dr. Arlyss Repress Reason for Consultation:     Symptomatic hemorrhoids        HPI:   Devon Lewis is a 43 y.o. male referred by Dr. Kyung Rudd, Alison Stalling, NP  for consultation & management of symptomatic hemorrhoids. He has no past medical history, presents with long-term history of symptoms related to hemorrhoids including perianal itching and irritation. He reports that he had some injection to the hemorrhoids about 10 years ago which provided complete relief for 2 years. He has been having intermittent symptoms which are self-limiting. However, for the last 2 months he has been experiencing intense perianal itching and irritation. He used Anusol suppository for about 6 days which provided some relief. For the last 1 week, he has recurrence of the symptoms. He does use Preparation H which provides temporary relief. He denies swelling, burning, rectal discomfort, bleeding or prolapse. He denies constipation or diarrhea. He takes fiber gummy daily  Follow-up visit 09/27/2018  Patient had outpatient hemorrhoid ligation of right anterior, right posterior and left lateral hemorrhoids in summer 2019.  His symptoms were completely in remission until 2 months ago when itching and irritation recurred.  He presents for follow-up to discuss about repeat hemorrhoid ligation.  He has been using Preparation H as needed which provides temporary relief.  He denies any other symptoms.  He reports having regular bowel movements.  NSAIDs: none  Antiplts/Anticoagulants/Anti thrombotics: none  GI Procedures: none He denies family history of GI malignancy  No past medical history on file.  Past Surgical History:  Procedure  Laterality Date  . HERNIA REPAIR    . KNEE ARTHROCENTESIS     No current outpatient medications on file.  No family history on file.   Social History   Tobacco Use  . Smoking status: Never Smoker  . Smokeless tobacco: Never Used  Substance Use Topics  . Alcohol use: No  . Drug use: No    Allergies as of 09/27/2018  . (No Known Allergies)    Review of Systems:    All systems reviewed and negative except where noted in HPI.   Physical Exam:  BP 128/80 (BP Location: Left Arm, Patient Position: Sitting, Cuff Size: Large)   Pulse 76   Resp 17   Ht 6\' 2"  (1.88 m)   Wt 224 lb (101.6 kg)   BMI 28.76 kg/m  No LMP for male patient.  General:   Alert,  Well-developed, well-nourished, pleasant and cooperative in NAD Head:  Normocephalic and atraumatic. Eyes:  Sclera clear, no icterus.   Conjunctiva pink. Ears:  Normal auditory acuity. Nose:  No deformity, discharge, or lesions. Mouth:  No deformity or lesions,oropharynx pink & moist. Neck:  Supple; no masses or thyromegaly. Lungs:  Respirations even and unlabored.  Clear throughout to auscultation.   No wheezes, crackles, or rhonchi. No acute distress. Heart:  Regular rate and rhythm; no murmurs, clicks, rubs, or gallops. Abdomen:  Normal bowel sounds. Soft, non-tender and non-distended without masses, hepatosplenomegaly or hernias noted.  No guarding or rebound tenderness.   Rectal: Nontender, there were no perianal lesions or rash Msk:  Symmetrical without gross deformities. Good, equal movement & strength bilaterally. Pulses:  Normal pulses noted. Extremities:  No clubbing or edema.  No cyanosis. Neurologic:  Alert and oriented x3;  grossly normal neurologically. Skin:  Intact without significant lesions or rashes. No jaundice. Psych:  Alert and cooperative. Normal mood and affect.  Imaging Studies: CT in 06/2008 unremarkable  Assessment and Plan:   KEYUR NOVOTNY is a 43 y.o. Caucasian male with grade 1 symptomatic  external hemorrhoids. Symptoms including perianal itching and irritation who underwent outpatient hemorrhoid ligation in summer, 2019 with resolution of symptoms for 6 months.  Symptoms returned around Thanksgiving.  He is here to discuss about hemorrhoid ligation  Advised patient that he would probably benefit from repeat hemorrhoid ligation Consent obtained  RP hemorrhoid ligation was performed today   Follow up in 2 weeks   Arlyss Repress, MD

## 2018-09-27 NOTE — Progress Notes (Signed)

## 2018-10-13 ENCOUNTER — Other Ambulatory Visit: Payer: Self-pay

## 2018-10-13 ENCOUNTER — Encounter: Payer: Self-pay | Admitting: Gastroenterology

## 2018-10-13 ENCOUNTER — Ambulatory Visit: Payer: 59 | Admitting: Gastroenterology

## 2018-10-13 VITALS — BP 126/81 | HR 92 | Resp 17 | Ht 74.0 in | Wt 223.8 lb

## 2018-10-13 DIAGNOSIS — K640 First degree hemorrhoids: Secondary | ICD-10-CM | POA: Insufficient documentation

## 2018-10-13 NOTE — Progress Notes (Signed)

## 2018-11-10 ENCOUNTER — Ambulatory Visit: Payer: 59 | Admitting: Gastroenterology

## 2018-11-10 ENCOUNTER — Encounter: Payer: Self-pay | Admitting: Gastroenterology

## 2018-11-10 ENCOUNTER — Other Ambulatory Visit: Payer: Self-pay

## 2018-11-10 VITALS — BP 129/82 | HR 92 | Resp 17 | Ht 74.0 in | Wt 222.2 lb

## 2018-11-10 DIAGNOSIS — B356 Tinea cruris: Secondary | ICD-10-CM | POA: Diagnosis not present

## 2018-11-10 DIAGNOSIS — L290 Pruritus ani: Secondary | ICD-10-CM | POA: Diagnosis not present

## 2018-11-10 MED ORDER — CLOTRIMAZOLE-BETAMETHASONE 1-0.05 % EX CREA: 1.0000 "application " | TOPICAL_CREAM | Freq: Two times a day (BID) | CUTANEOUS | 1 refills | Status: DC

## 2018-11-10 NOTE — Progress Notes (Deleted)

## 2018-11-10 NOTE — Progress Notes (Signed)
Devon Repress, MD [address]  [address]  [place], [place] [address]  Main: [phone]  Fax: [phone]    Gastroenterology Consultation  Referring Provider:     Galen Lewis, * Primary Care Physician:  Devon Manila, NP Primary Gastroenterologist:  Dr. Arlyss Lewis Reason for Consultation:     Symptomatic hemorrhoids        HPI:   Devon Lewis is a 43 y.o. male referred by Dr. Kyung Lewis, Devon Stalling, NP  for consultation & management of symptomatic hemorrhoids. He has no past medical history, presents with long-term history of symptoms related to hemorrhoids including perianal itching and irritation. He reports that he had some injection to the hemorrhoids about 10 years ago which provided complete relief for 2 years. He has been having intermittent symptoms which are self-limiting. However, for the last 2 months he has been experiencing intense perianal itching and irritation. He used Anusol suppository for about 6 days which provided some relief. For the last 1 week, he has recurrence of the symptoms. He does use Preparation H which provides temporary relief. He denies swelling, burning, rectal discomfort, bleeding or prolapse. He denies constipation or diarrhea. He takes fiber gummy daily  Follow-up visit 09/27/2018  Patient had outpatient hemorrhoid ligation of right anterior, right posterior and left lateral hemorrhoids in summer 2019.  His symptoms were completely in remission until 2 months ago when itching and irritation recurred.  He presents for follow-up to discuss about repeat hemorrhoid ligation.  He has been using Preparation H as needed which provides temporary relief.  He denies any other symptoms.  He reports having regular bowel movements.  Follow-up visit 11/10/2018 He reports ongoing perianal itching.  He also reports fungal infection in his groin as well as genital area with rash for which he is applying selenium sulfide prescribed by his  dermatologist.  He tried calmoseptine for perianal itching which provide temporary relief only.  NSAIDs: none  Antiplts/Anticoagulants/Anti thrombotics: none  GI Procedures: none He denies family history of GI malignancy  Past Medical History:  Diagnosis Date  . Chest tightness 10/21/2015  . Sensation of lump in throat 10/21/2015    Past Surgical History:  Procedure Laterality Date  . HERNIA REPAIR    . KNEE ARTHROCENTESIS      Current Outpatient Medications:  .  clotrimazole-betamethasone (LOTRISONE) cream, Apply 1 application topically 2 (two) times daily., Disp: 15 g, Rfl: 1 .  predniSONE (STERAPRED UNI-PAK 21 TAB) 10 MG (21) TBPK tablet, , Disp: , Rfl:   No family history on file.   Social History   Tobacco Use  . Smoking status: Never Smoker  . Smokeless tobacco: Never Used  Substance Use Topics  . Alcohol use: No  . Drug use: No    Allergies as of 11/10/2018  . (No Known Allergies)    Review of Systems:    All systems reviewed and negative except where noted in HPI.   Physical Exam:  BP 129/82 (BP Location: Left Arm, Patient Position: Sitting, Cuff Size: Large)   Pulse 92   Resp 17   Ht 6\' 2"  (1.88 m)   Wt 222 lb 3.2 oz (100.8 kg)   BMI 28.53 kg/m  No LMP for male patient.  General:   Alert,  Well-developed, well-nourished, pleasant and cooperative in NAD Head:  Normocephalic and atraumatic. Eyes:  Sclera clear, no icterus.   Conjunctiva pink. Ears:  Normal auditory acuity. Nose:  No deformity, discharge, or lesions.  Mouth:  No deformity or lesions,oropharynx pink & moist. Neck:  Supple; no masses or thyromegaly. Lungs:  Respirations even and unlabored.  Clear throughout to auscultation.   No wheezes, crackles, or rhonchi. No acute distress. Heart:  Regular rate and rhythm; no murmurs, clicks, rubs, or gallops. Abdomen:  Normal bowel sounds. Soft, non-tender and non-distended without masses, hepatosplenomegaly or hernias noted.  No guarding or  rebound tenderness.   Rectal: Not examined Msk:  Symmetrical without gross deformities. Good, equal movement & strength bilaterally. Pulses:  Normal pulses noted. Extremities:  No clubbing or edema.  No cyanosis. Neurologic:  Alert and oriented x3;  grossly normal neurologically. Skin:  Intact without significant lesions or rashes. No jaundice. Psych:  Alert and cooperative. Normal mood and affect.  Imaging Studies: CT in 06/2008 unremarkable  Assessment and Plan:   Devon Lewis is a 43 y.o. Caucasian male with grade 1 symptomatic external hemorrhoids. Symptoms including perianal itching and irritation who underwent outpatient hemorrhoid ligation in summer, 2019 with resolution of symptoms for 6 months.  Symptoms returned about 4 months later with persistent perianal itching and irritation, rash and history of tenia cruris.  His perianal itching could be an extension of tinea cruris.  Therefore, I do not recommend hemorrhoid ligation today.  He denies any other hemorrhoidal symptoms  Recommend trial of topical clotrimazole cream in the perianal area 2 times daily for 2 weeks   Follow up in 2 weeks   Devon Repress, MD

## 2018-11-23 ENCOUNTER — Ambulatory Visit: Payer: 59 | Admitting: Gastroenterology

## 2019-04-03 ENCOUNTER — Telehealth: Payer: Self-pay | Admitting: Gastroenterology

## 2019-04-03 NOTE — Telephone Encounter (Signed)
Patient called & would like a call from the Office Manager to help him with his bill. He has been getting the run around. He states he called about a month ago an has not heard back.

## 2019-05-22 ENCOUNTER — Telehealth: Payer: Self-pay | Admitting: Gastroenterology

## 2019-05-22 NOTE — Telephone Encounter (Signed)
Devon Lewis, Jody Fidel Levy, MD  Caller: Unspecified (1 month ago)        Talked with patient discussed the conversation you had with him earlier today. I explained as per the billing office we cannot write off his bill because of the insurance contracts. We feel the treatment and the billing are both correct. However he is a little confused as to why the insurance processed his claim the way they did. I offered for him to send me his EOB's and I would be happy to take a look to help him get a better understanding of his bill. He doesn't know where they are but I again offered if he would like to find them I am happy to help. He seemed ok and I also offered for him to call me back if he has any problems and ask to be sent to me as a high priority message.

## 2019-05-22 NOTE — Telephone Encounter (Signed)
Called patient this morning to discuss about his concern regarding the bill of $1400 he received related to hemorrhoid ligation x 2 that I performed in early 2020 as he felt that he was misdiagnosed and mistreated, banding was unnecessary and he felt better after topical antifungal cream for perianal rash. I explained to the patient that the perianal fungal rash did not appear until his last visit in 11/2018. Given the fact that his symptoms improved after hemorrhoid banding x3 in 09/2018 and then later recurred, it was felt he might benefit from repeat ligation and there was no perianal rash during his visit in 09/2018. I proceeded with banding after obtaining consent from patient.   Patient did not have to pay out of pocket for ligation in 2019. He said his insurance plan changed in 2020 and he did not realise that he would have high copay for banding with his new plan. I told him my office manager will call him regarding the bill

## 2021-11-20 ENCOUNTER — Emergency Department: Payer: BC Managed Care – PPO

## 2021-11-20 ENCOUNTER — Other Ambulatory Visit: Payer: Self-pay

## 2021-11-20 ENCOUNTER — Ambulatory Visit: Payer: Self-pay

## 2021-11-20 ENCOUNTER — Emergency Department
Admission: EM | Admit: 2021-11-20 | Discharge: 2021-11-20 | Disposition: A | Discharge status: Home / Self Care | Payer: BC Managed Care – PPO | Attending: Student in an Organized Health Care Education/Training Program | Admitting: Student in an Organized Health Care Education/Training Program

## 2021-11-20 ENCOUNTER — Encounter: Payer: Self-pay | Admitting: Emergency Medicine

## 2021-11-20 DIAGNOSIS — R0789 Other chest pain: Secondary | ICD-10-CM | POA: Diagnosis not present

## 2021-11-20 DIAGNOSIS — R079 Chest pain, unspecified: Secondary | ICD-10-CM | POA: Diagnosis not present

## 2021-11-20 DIAGNOSIS — R072 Precordial pain: Secondary | ICD-10-CM | POA: Insufficient documentation

## 2021-11-20 LAB — BASIC METABOLIC PANEL
Anion gap: 7 (ref 5–15)
BUN: 18 mg/dL (ref 6–20)
CO2: 26 mmol/L (ref 22–32)
Calcium: 8.9 mg/dL (ref 8.9–10.3)
Chloride: 106 mmol/L (ref 98–111)
Creatinine, Ser: 1.2 mg/dL (ref 0.61–1.24)
GFR, Estimated: >60 mL/min (ref >60)
Glucose, Bld: 117 mg/dL — ABNORMAL HIGH (ref 70–99)
Potassium: 3.7 mmol/L (ref 3.5–5.1)
Sodium: 139 mmol/L (ref 135–145)

## 2021-11-20 LAB — HEPATIC FUNCTION PANEL
ALT: 26 U/L (ref 0–44)
AST: 21 U/L (ref 15–41)
Albumin: 4.1 g/dL (ref 3.5–5.0)
Alkaline Phosphatase: 53 U/L (ref 38–126)
Bilirubin, Direct: <0.1 mg/dL (ref 0.0–0.2)
Total Bilirubin: 0.7 mg/dL (ref 0.3–1.2)
Total Protein: 7.4 g/dL (ref 6.5–8.1)

## 2021-11-20 LAB — LIPASE, BLOOD: Lipase: 39 U/L (ref 11–51)

## 2021-11-20 LAB — CBC
HCT: 47 % (ref 39.0–52.0)
Hemoglobin: 16.1 g/dL (ref 13.0–17.0)
MCH: 29.8 pg (ref 26.0–34.0)
MCHC: 34.3 g/dL (ref 30.0–36.0)
MCV: 87 fL (ref 80.0–100.0)
Platelets: 276 10*3/uL (ref 150–400)
RBC: 5.4 MIL/uL (ref 4.22–5.81)
RDW: 11.9 % (ref 11.5–15.5)
WBC: 6.6 10*3/uL (ref 4.0–10.5)
nRBC: 0 % (ref 0.0–0.2)

## 2021-11-20 LAB — TROPONIN I (HIGH SENSITIVITY)
Troponin I (High Sensitivity): 3 ng/L (ref <18)
Troponin I (High Sensitivity): 3 ng/L (ref <18)

## 2021-11-20 MED ORDER — LIDOCAINE VISCOUS HCL 2 % MT SOLN
15.0000 mL | Freq: Once | OROMUCOSAL | Status: AC
Start: 2021-11-20 — End: 2021-11-20
  Administered 2021-11-20: 15 mL via ORAL
  Filled 2021-11-20: qty 15

## 2021-11-20 MED ORDER — NITROGLYCERIN 0.4 MG SL SUBL
0.4000 mg | SUBLINGUAL_TABLET | SUBLINGUAL | Status: DC | PRN
  Administered 2021-11-20: 0.4 mg via SUBLINGUAL
  Filled 2021-11-20: qty 1

## 2021-11-20 MED ORDER — ALUM & MAG HYDROXIDE-SIMETH 200-200-20 MG/5ML PO SUSP
30.0000 mL | Freq: Once | ORAL | Status: AC
  Administered 2021-11-20: 30 mL via ORAL
  Filled 2021-11-20: qty 30

## 2021-11-20 NOTE — ED Notes (Signed)
Pt reports no change in CP ?

## 2021-11-20 NOTE — ED Triage Notes (Signed)
Pt via POV from home. Pt c/o mid-sternal CP and burning since yesterday. Denies any SOB. Denies any cardiac hx but extensive cardiac hx in his family. Pt is A&OX4 and NAD ?

## 2021-11-20 NOTE — ED Notes (Signed)
Pt NAD, a/ox4. Pt verbalizes understanding of all DC and f/u instructions. All questions answered. Pt walks with steady gait to lobby at DC.  ? ?

## 2021-11-20 NOTE — ED Notes (Signed)
Pt NAD in bed, a/ox4. Pt states he has had intermittent non radiating chest burning x 7 days. Pt points to epigastric region. Pt states it became worse after eating. -SOB, n/v, ABD pain. ABD soft non tender ?

## 2021-11-20 NOTE — ED Provider Notes (Signed)
? ?[facility] ?Provider Note ? ? ? Event Date/Time  ? First MD Initiated Contact with Patient 11/20/21 1003   ?  (approximate) ? ? ?History  ? ?Chest Pain ? ? ?HPI ? ?Devon Lewis is a 46 y.o. male no significant past medical history does have family history of CAD brother recently died from cardiovascular disease presents to the ER for evaluation midsternal epigastric and chest discomfort intermittent over the past 7 days.  And states he was having more severe episode this morning described as a burning sensation in his chest and did have some feeling of a lump in his throat last night.  States he was having discomfort when he was checking to the ER but states he is currently pain-free.  Denies any diaphoresis.  No congestion has had an intermittent cough does not smoke.  No recent antibiotics.  Denies any abdominal pain. ?  ? ? ?Physical Exam  ? ?Triage Vital Signs: ?ED Triage Vitals  ?Enc Vitals Group  ?   BP 11/20/21 0939 (!) 149/89  ?   Pulse Rate 11/20/21 0939 80  ?   Resp 11/20/21 0939 18  ?   Temp 11/20/21 0939 98.9 ?F (37.2 ?C)  ?   Temp Source 11/20/21 0939 Oral  ?   SpO2 11/20/21 0939 98 %  ?   Weight 11/20/21 0935 230 lb (104.3 kg)  ?   Height 11/20/21 0935 6\' 2"  (1.88 m)  ?   Head Circumference --   ?   Peak Flow --   ?   Pain Score 11/20/21 0935 4  ?   Pain Loc --   ?   Pain Edu? --   ?   Excl. in [place]? --   ? ? ?Most recent vital signs: ?Vitals:  ? 11/20/21 0939  ?BP: (!) 149/89  ?Pulse: 80  ?Resp: 18  ?Temp: 98.9 ?F (37.2 ?C)  ?SpO2: 98%  ? ? ? ?Constitutional: Alert  ?Eyes: Conjunctivae are normal.  ?Head: Atraumatic. ?Nose: No congestion/rhinnorhea. ?Mouth/Throat: Mucous membranes are moist.   ?Neck: Painless ROM.  ?Cardiovascular:   Good peripheral circulation.  No m/g/r ?Respiratory: Normal respiratory effort.  No retractions.ctab  ?Gastrointestinal: Soft and nontender.  ?Musculoskeletal:  no deformity ?Neurologic:  MAE spontaneously. No gross focal neurologic deficits are  appreciated.  ?Skin:  Skin is warm, dry and intact. No rash noted. ?Psychiatric: Mood and affect are normal. Speech and behavior are normal. ? ? ? ?ED Results / Procedures / Treatments  ? ?Labs ?(all labs ordered are listed, but only abnormal results are displayed) ?Labs Reviewed  ?CBC  ?BASIC METABOLIC PANEL  ?TROPONIN I (HIGH SENSITIVITY)  ? ? ? ?EKG ? ?ED ECG REPORT ?I, Merlyn Lot, the attending physician, personally viewed and interpreted this ECG. ? ? Date: 11/20/2021 ? EKG Time: 9:36 ? Rate: 80 ? Rhythm: sinus ? Axis: normal ? Intervals: normal ? ST&T Change:  no stemi, no depression ? ? ? ?RADIOLOGY ?Please see ED Course for my review and interpretation. ? ?I personally reviewed all radiographic images ordered to evaluate for the above acute complaints and reviewed radiology reports and findings.  These findings were personally discussed with the patient.  Please see medical record for radiology report. ? ? ? ?PROCEDURES: ? ?Critical Care performed: No ? ?Procedures ? ? ?MEDICATIONS ORDERED IN ED: ?Medications - No data to display ? ? ?IMPRESSION / MDM / ASSESSMENT AND PLAN / ED COURSE  ?I reviewed the triage vital signs and the  nursing notes. ?             ?               ? ?Differential diagnosis includes, but is not limited to, ACS, pericarditis, esophagitis, boerhaaves, pe, dissection, pna, bronchitis, costochondritis ? ?Patient presenting with symptoms as described above.  He is clinically very well-appearing in no acute distress.  His EKG is nonischemic initial troponin negative.  Will order chest x-ray.  Will order blood work as well as belly labs.  I have a low suspicion for intra-abdominal process.  He is low risk by Wells criteria he is PERC negative.  Does not seem clinically consistent with dissection.  I do not appreciate any findings suggest pneumonia or bronchitis.  Possible reflux. ? ?Clinical Course as of 11/20/21 1303  ?Fri Nov 20, 2021  ?1035 Chest x-ray on my review interpretation  does not show any evidence of pneumothorax or consolidation. [PR]  ?1301 Patient reassessed.  Remains well-appearing in no acute distress.  Denies any pain.  He did not have any significant change with nitro no significant change with GI cocktail.  I discussed option for observation in the hospital for formal chest pain rule out versus outpatient follow-up as his troponins are negative.  Patient feels comfortable with close outpatient follow-up.  We discussed return precautions.  Patient agreeable plan. [PR]  ?  ?Clinical Course User Index ?[PR] Merlyn Lot, MD  ? ? ? ?FINAL CLINICAL IMPRESSION(S) / ED DIAGNOSES  ? ?Final diagnoses:  ?Nonspecific chest pain  ? ? ? ?Rx / DC Orders  ? ?ED Discharge Orders   ? ? None  ? ?  ? ? ? ?Note:  This document was prepared using Dragon voice recognition software and may include unintentional dictation errors. ? ?  ?Merlyn Lot, MD ?11/20/21 1303 ? ?

## 2021-11-20 NOTE — Telephone Encounter (Signed)
? ? ?  Chief Complaint: Chest pain ?Symptoms: Pain, mils SOB ?Frequency: Started lest week, comes and goes ?Pertinent Negatives: Patient denies nausea ?Disposition: [x] ED /[] Urgent Care (no appt availability in office) / [] Appointment(In office/virtual)/ []  [facility]/ [] Home Care/ [] Refused Recommended Disposition /[] [facility]/ []  Follow-up with PCP ?Additional Notes: Has appointment to re-establish with Medical.  ?Reason for Disposition ? Difficulty breathing ? ?Answer Assessment - Initial Assessment Questions ?1. LOCATION: "Where does it hurt?"   ?    Middle ?2. RADIATION: "Does the pain go anywhere else?" (e.g., into neck, jaw, arms, back) ?    Last night hurt in throat ?3. ONSET: "When did the chest pain begin?" (Minutes, hours or days)  ?    Last week ?4. PATTERN "Does the pain come and go, or has it been constant since it started?"  "Does it get worse with exertion?"  ?    Comes and goes ?5. DURATION: "How long does it last" (e.g., seconds, minutes, hours) ?    1 hour or longer ?6. SEVERITY: "How bad is the pain?"  (e.g., Scale 1-10; mild, moderate, or severe) ?   - MILD (1-3): doesn't interfere with normal activities  ?   - MODERATE (4-7): interferes with normal activities or awakens from sleep ?   - SEVERE (8-10): excruciating pain, unable to do any normal activities   ?    Now - 3-4 ?7. CARDIAC RISK FACTORS: "Do you have any history of heart problems or risk factors for heart disease?" (e.g., angina, prior heart attack; diabetes, high blood pressure, high cholesterol, smoker, or strong family history of heart disease) ?    No ?8. PULMONARY RISK FACTORS: "Do you have any history of lung disease?"  (e.g., blood clots in lung, asthma, emphysema, birth control pills) ?    No ?9. CAUSE: "What do you think is causing the chest pain?" ?    Unsure ?10. OTHER SYMPTOMS: "Do you have any other symptoms?" (e.g., dizziness, nausea, vomiting, sweating, fever, difficulty breathing,  cough) ?      Harder to breath ?11. PREGNANCY: "Is there any chance you are pregnant?" "When was your last menstrual period?" ?      N/a ? ?Protocols used: Chest Pain-A-AH ? ?

## 2021-11-20 NOTE — ED Notes (Signed)
ED Provider at bedside. 

## 2021-11-25 ENCOUNTER — Telehealth: Payer: Self-pay

## 2021-11-25 NOTE — Telephone Encounter (Signed)
L MOM to schedule ED follow up 

## 2021-12-17 ENCOUNTER — Ambulatory Visit
Admission: RE | Admit: 2021-12-17 | Discharge: 2021-12-17 | Disposition: A | Discharge status: Home / Self Care | Payer: BC Managed Care – PPO | Source: Ambulatory Visit

## 2021-12-17 VITALS — BP 121/76 | HR 82 | Temp 99.3°F | Resp 18

## 2021-12-17 DIAGNOSIS — R1032 Left lower quadrant pain: Secondary | ICD-10-CM | POA: Diagnosis not present

## 2021-12-17 NOTE — Discharge Instructions (Addendum)
-  Please call your GI doctor today and see if they can order outpatient imaging.  If they cannot, you may need to head to the emergency department for further evaluation.  Make sure that you head straight to the emergency department if symptoms get worse, including abdominal pain, or new: constipation, nausea, fevers. ?

## 2021-12-17 NOTE — ED Provider Notes (Signed)
?[facility] ? ? ? ?CSN: [id number] ?Arrival date & time: 12/17/21  1511 ? ? ?  ? ?History   ?Chief Complaint ?Chief Complaint  ?Patient presents with  ? Abdominal Pain  ?  Entered by patient  ? ? ?HPI ?Devon Lewis is a 46 y.o. male presenting with abdominal pain.  History laparoscopic hernia repair status post insertion of mesh years ago, no other prior abdominal procedures.  States he was doing some heavy lifting 2 weeks ago when he felt some strange pain in the left lower quadrant.  He continues to have the pain, worse with movement.  States it feels like a bubble wrap when he pushes on it.  States he was initially bloated and constipated, but this seems to have resolved; last BM was 3 hours ago. Denies n/v/d/c, fevers/chills, urinary symptoms. States he called his old Psychologist, sport and exercise, who told him to go to urgent care.  ? ?HPI ? ?Past Medical History:  ?Diagnosis Date  ? Chest tightness 10/21/2015  ? Sensation of lump in throat 10/21/2015  ? ? ?Patient Active Problem List  ? Diagnosis Date Noted  ? Tinea cruris 11/10/2018  ? Grade I hemorrhoids 10/13/2018  ? Mild hypercholesterolemia 11/05/2015  ? ? ?Past Surgical History:  ?Procedure Laterality Date  ? HERNIA REPAIR    ? KNEE ARTHROCENTESIS    ? ? ? ? ? ?Home Medications   ? ?Prior to Admission medications   ?Medication Sig Start Date End Date Taking? Authorizing Provider  ?clotrimazole-betamethasone (LOTRISONE) cream Apply 1 application topically 2 (two) times daily. ?Patient not taking: Reported on 11/20/2021 11/10/18   Lin Landsman, MD  ? ? ?Family History ?History reviewed. No pertinent family history. ? ?Social History ?Social History  ? ?Tobacco Use  ? Smoking status: Never  ? Smokeless tobacco: Never  ?Vaping Use  ? Vaping Use: Never used  ?Substance Use Topics  ? Alcohol use: No  ? Drug use: No  ? ? ? ?Allergies   ?Patient has no known allergies. ? ? ?Review of Systems ?Review of Systems  ?Gastrointestinal:  Positive for abdominal pain.  ?All other  systems reviewed and are negative. ? ? ?Physical Exam ?Triage Vital Signs ?ED Triage Vitals  ?Enc Vitals Group  ?   BP 12/17/21 1545 121/76  ?   Pulse Rate 12/17/21 1545 82  ?   Resp 12/17/21 1545 18  ?   Temp 12/17/21 1545 99.3 ?F (37.4 ?C)  ?   Temp Source 12/17/21 1545 Oral  ?   SpO2 12/17/21 1545 94 %  ?   Weight --   ?   Height --   ?   Head Circumference --   ?   Peak Flow --   ?   Pain Score 12/17/21 1542 3  ?   Pain Loc --   ?   Pain Edu? --   ?   Excl. in [place]? --   ? ?No data found. ? ?Updated Vital Signs ?BP 121/76 (BP Location: Right Arm)   Pulse 82   Temp 99.3 ?F (37.4 ?C) (Oral)   Resp 18   SpO2 94%  ? ?Visual Acuity ?Right Eye Distance:   ?Left Eye Distance:   ?Bilateral Distance:   ? ?Right Eye Near:   ?Left Eye Near:    ?Bilateral Near:    ? ?Physical Exam ?Vitals reviewed.  ?Constitutional:   ?   General: He is not in acute distress. ?   Appearance: Normal appearance. He is  not ill-appearing.  ?HENT:  ?   Head: Normocephalic and atraumatic.  ?   Mouth/Throat:  ?   Mouth: Mucous membranes are moist.  ?   Comments: Moist mucous membranes ?Eyes:  ?   Extraocular Movements: Extraocular movements intact.  ?   Pupils: Pupils are equal, round, and reactive to light.  ?Cardiovascular:  ?   Rate and Rhythm: Normal rate and regular rhythm.  ?   Heart sounds: Normal heart sounds.  ?Pulmonary:  ?   Effort: Pulmonary effort is normal.  ?   Breath sounds: Normal breath sounds. No wheezing, rhonchi or rales.  ?Abdominal:  ?   General: Bowel sounds are normal. There is no distension.  ?   Palpations: Abdomen is soft. There is no mass.  ?   Tenderness: There is abdominal tenderness in the left lower quadrant. There is no right CVA tenderness, left CVA tenderness, guarding or rebound. Negative signs include Murphy's sign, Rovsing's sign and McBurney's sign.  ?   Comments: BS positive throughout. Minimally TTP LLQ, without guarding or rebound. No obvious mass or hernia. Comfortable throughout exam.   ?Skin: ?    General: Skin is warm.  ?   Capillary Refill: Capillary refill takes less than 2 seconds.  ?   Comments: Good skin turgor  ?Neurological:  ?   General: No focal deficit present.  ?   Mental Status: He is alert and oriented to person, place, and time.  ?Psychiatric:     ?   Mood and Affect: Mood normal.     ?   Behavior: Behavior normal.  ? ? ? ?UC Treatments / Results  ?Labs ?(all labs ordered are listed, but only abnormal results are displayed) ?Labs Reviewed - No data to display ? ?EKG ? ? ?Radiology ?No results found. ? ?Procedures ?Procedures (including critical care time) ? ?Medications Ordered in UC ?Medications - No data to display ? ?Initial Impression / Assessment and Plan / UC Course  ?I have reviewed the triage vital signs and the nursing notes. ? ?Pertinent labs & imaging results that were available during my care of the patient were reviewed by me and considered in my medical decision making (see chart for details). ? ?  ? ?This patient is a very pleasant 46 y.o. year old male presenting with abd pain following heavy lifting. S/p laparoscopic hernia repair >3 years ago. I do have concern for hernia. However, on exam, there is minimal tenderness to palpation, BS positive throughout. No palpable mass or hernia.  He adamantly does not want to go to the emergency department, and does not currently have a PCP or surgeon, so I advised him to call his gastroenterologist up today and ask if they can order some outpatient imaging.  He is amenable.  He understands if symptoms get worse AT ALL, he needs to head to the emergency department immediately. ? ?Final Clinical Impressions(s) / UC Diagnoses  ? ?Final diagnoses:  ?Left lower quadrant abdominal pain  ? ? ? ?Discharge Instructions   ? ?  ?-Please call your GI doctor today and see if they can order outpatient imaging.  If they cannot, you may need to head to the emergency department for further evaluation.  Make sure that you head straight to the emergency  department if symptoms get worse, including abdominal pain, or new: constipation, nausea, fevers. ? ? ?ED Prescriptions   ?None ?  ? ?PDMP not reviewed this encounter. ?  ?Hazel Sams, PA-C ?12/17/21 1606 ? ?

## 2021-12-17 NOTE — ED Triage Notes (Signed)
Pt presents with left side abdominal pain after picking up a heavy item 2 weeks ago. Pt does have a history of hernias and states he has a mesh in his abdomen he's concerned that tore.  ?

## 2021-12-21 ENCOUNTER — Ambulatory Visit: Payer: BC Managed Care – PPO | Admitting: Nurse Practitioner

## 2021-12-21 ENCOUNTER — Encounter: Payer: Self-pay | Admitting: Nurse Practitioner

## 2021-12-21 VITALS — BP 120/72 | HR 71 | Ht 74.0 in | Wt 220.0 lb

## 2021-12-21 DIAGNOSIS — R1084 Generalized abdominal pain: Secondary | ICD-10-CM | POA: Diagnosis not present

## 2021-12-21 DIAGNOSIS — Z9889 Other specified postprocedural states: Secondary | ICD-10-CM | POA: Diagnosis not present

## 2021-12-21 DIAGNOSIS — R1032 Left lower quadrant pain: Secondary | ICD-10-CM

## 2021-12-21 DIAGNOSIS — Z8719 Personal history of other diseases of the digestive system: Secondary | ICD-10-CM

## 2021-12-21 DIAGNOSIS — Z1211 Encounter for screening for malignant neoplasm of colon: Secondary | ICD-10-CM

## 2021-12-21 DIAGNOSIS — R3121 Asymptomatic microscopic hematuria: Secondary | ICD-10-CM | POA: Diagnosis not present

## 2021-12-21 LAB — URINALYSIS, MICROSCOPIC ONLY

## 2021-12-21 LAB — COMPREHENSIVE METABOLIC PANEL
ALT: 17 U/L (ref 0–53)
AST: 14 U/L (ref 0–37)
Albumin: 4.5 g/dL (ref 3.5–5.2)
Alkaline Phosphatase: 74 U/L (ref 39–117)
BUN: 16 mg/dL (ref 6–23)
CO2: 30 mEq/L (ref 19–32)
Calcium: 9.3 mg/dL (ref 8.4–10.5)
Chloride: 103 mEq/L (ref 96–112)
Creatinine, Ser: 1.15 mg/dL (ref 0.40–1.50)
GFR: 76.53 mL/min (ref >60.00)
Glucose, Bld: 90 mg/dL (ref 70–99)
Potassium: 4.1 mEq/L (ref 3.5–5.1)
Sodium: 140 mEq/L (ref 135–145)
Total Bilirubin: 0.6 mg/dL (ref 0.2–1.2)
Total Protein: 7.7 g/dL (ref 6.0–8.3)

## 2021-12-21 LAB — CBC WITH DIFFERENTIAL/PLATELET
Basophils Absolute: 0 10*3/uL (ref 0.0–0.1)
Basophils Relative: 0.5 % (ref 0.0–3.0)
Eosinophils Absolute: 0.1 10*3/uL (ref 0.0–0.7)
Eosinophils Relative: 0.8 % (ref 0.0–5.0)
HCT: 47.2 % (ref 39.0–52.0)
Hemoglobin: 16.4 g/dL (ref 13.0–17.0)
Lymphocytes Relative: 23.1 % (ref 12.0–46.0)
Lymphs Abs: 1.6 10*3/uL (ref 0.7–4.0)
MCHC: 34.7 g/dL (ref 30.0–36.0)
MCV: 87.4 fl (ref 78.0–100.0)
Monocytes Absolute: 0.4 10*3/uL (ref 0.1–1.0)
Monocytes Relative: 6 % (ref 3.0–12.0)
Neutro Abs: 4.8 10*3/uL (ref 1.4–7.7)
Neutrophils Relative %: 69.6 % (ref 43.0–77.0)
Platelets: 270 10*3/uL (ref 150.0–400.0)
RBC: 5.4 Mil/uL (ref 4.22–5.81)
RDW: 12.8 % (ref 11.5–15.5)
WBC: 6.9 10*3/uL (ref 4.0–10.5)

## 2021-12-21 LAB — POCT URINALYSIS DIP (CLINITEK)
Bilirubin, UA: NEGATIVE
Glucose, UA: NEGATIVE mg/dL
Ketones, POC UA: NEGATIVE mg/dL
Leukocytes, UA: NEGATIVE
Nitrite, UA: NEGATIVE
POC PROTEIN,UA: NEGATIVE
Spec Grav, UA: 1.015 (ref 1.010–1.025)
Urobilinogen, UA: NEGATIVE E.U./dL — AB
pH, UA: 6.5 (ref 5.0–8.0)

## 2021-12-21 LAB — LIPASE: Lipase: 18 U/L (ref 11.0–59.0)

## 2021-12-21 NOTE — Assessment & Plan Note (Signed)
History of bilateral inguinal hernia repair with mesh placement.  States it was performed at [facility].  Patient was seen in urgent care approximate 2 weeks ago without improvement of abdominal pain concern for mesh involvement as he states that his mesh is currently recalled.  Pending CT scan ?

## 2021-12-21 NOTE — Patient Instructions (Signed)
Nice to see you today ?I will be in touch with the lab results ?I have also ordered the CT scan for [place] ?Follow up with me in 6 months for a physical ?

## 2021-12-21 NOTE — Assessment & Plan Note (Signed)
Nonspecific abdominal pain.  Located in the left lower quadrant that does radiate down to inguinal area and groin per patient report does have a history of bilateral inguinal hernia repairs with mesh placement.  States it feels similar to after the procedure when his mesh was "pulling".  Did discuss different options and agreed to pursue CT scan of abdomen pelvis with contrast.  To rule out  mesh complications and possible new hernia.  Although hernia was not palpated on exam ?

## 2021-12-21 NOTE — Assessment & Plan Note (Signed)
No finding on UA in office.  We will send off for microscopy to see if true blood in urine. ?

## 2021-12-21 NOTE — Progress Notes (Signed)
? ?New Patient Office Visit ? ?Subjective:  ?Patient ID: Devon Lewis, male    DOB: 01-26-1976  Age: 46 y.o. MRN: [id number] ? ?CC:  ?Chief Complaint  ?Patient presents with  ? Abdominal Pain  ?  New patient , having left side pain on stomach, pt was lefting at weight, thinks he may pulled a muscle, happened over 2 weeks ago, when to urgert care and was told follow up ED or GI , not taking any for pain   ? ? ?HPI ?Devon Lewis presents for Abdominal pain  ? ?Adbominal pian that was 2 weeks ago. Was placing a tire on the car. States that it felt like bubble wrap popping.  States that for 2 days he had a burngin sensatoin. States that when he is stiitng he can feel tugging sensation. The pain will go into the inguinal and down into the groin. No urinary sympotms. Negative Nocturia. Last BM was mon-Friday daily. States that satruday was the last BM. ? ?No history of diverticula and no GI doctor ?[facility] did the bilateral inguinal surgery with mesh placement  ? ?Past Medical History:  ?Diagnosis Date  ? Chest tightness 10/21/2015  ? Sensation of lump in throat 10/21/2015  ? ? ?Past Surgical History:  ?Procedure Laterality Date  ? HERNIA REPAIR    ? KNEE ARTHROCENTESIS    ? ? ?Family History  ?Problem Relation Age of Onset  ? Heart attack Mother   ? Heart attack Brother   ? ? ?Social History  ? ?Socioeconomic History  ? Marital status: Married  ?  Spouse name: Not on file  ? Number of children: 2  ? Years of education: Not on file  ? Highest education level: Not on file  ?Occupational History  ? Not on file  ?Tobacco Use  ? Smoking status: Never  ? Smokeless tobacco: Never  ?Vaping Use  ? Vaping Use: Never used  ?Substance and Sexual Activity  ? Alcohol use: Yes  ?  Comment: very rarely  ? Drug use: No  ? Sexual activity: Not on file  ?Other Topics Concern  ? Not on file  ?Social History Narrative  ? Fulltime: Education administrator  ? ?Social Determinants of Health  ? ?Financial Resource Strain: Not on file  ?Food  Insecurity: Not on file  ?Transportation Needs: Not on file  ?Physical Activity: Not on file  ?Stress: Not on file  ?Social Connections: Not on file  ?Intimate Partner Violence: Not on file  ? ? ?ROS ?Review of Systems  ?Constitutional:  Negative for chills and fever.  ?Respiratory:  Negative for shortness of breath.   ?Cardiovascular:  Negative for chest pain.  ?Gastrointestinal:  Positive for abdominal pain. Negative for nausea and vomiting.  ?Genitourinary:  Negative for difficulty urinating, dysuria, flank pain, frequency and hematuria.  ? ?Objective:  ? ?Today's Vitals: BP 120/72   Pulse 71   Ht 6\' 2"  (1.88 m)   Wt 220 lb (99.8 kg)   SpO2 96%   BMI 28.25 kg/m?  ? ?Physical Exam ?Vitals and nursing note reviewed. Exam conducted with a chaperone present , CMA).  ?Constitutional:   ?   Appearance: He is well-developed.  ?Cardiovascular:  ?   Rate and Rhythm: Normal rate and regular rhythm.  ?Pulmonary:  ?   Effort: Pulmonary effort is normal.  ?   Breath sounds: Normal breath sounds.  ?Abdominal:  ?   General: Bowel sounds are normal. There is no distension.  ?  Palpations: There is no mass.  ?   Tenderness: There is abdominal tenderness.  ?   Hernia: No hernia is present. There is no hernia in the left inguinal area or right inguinal area.  ? ? ?Genitourinary: ?   Penis: Normal.   ?   Testes: Normal.  ?   Epididymis:  ?   Right: Normal.  ?   Left: Normal.  ?Musculoskeletal:     ?   General: No tenderness.  ?   Right lower leg: No edema.  ?   Left lower leg: No edema.  ?Lymphadenopathy:  ?   Lower Body: No right inguinal adenopathy. No left inguinal adenopathy.  ?Neurological:  ?   Mental Status: He is alert.  ?   Deep Tendon Reflexes:  ?   Reflex Scores: ?     Bicep reflexes are 2+ on the right side and 2+ on the left side. ?     Patellar reflexes are 2+ on the right side and 2+ on the left side. ?   Comments: Bilateral upper and lower extremity strength 5/5  ? ? ?Assessment & Plan:   ? ?Problem List Items Addressed This Visit   ? ?  ? Genitourinary  ? Asymptomatic microscopic hematuria  ?  No finding on UA in office.  We will send off for microscopy to see if true blood in urine. ? ?  ?  ? Relevant Orders  ? Urine Microscopic (Completed)  ?  ? Other  ? Generalized abdominal pain - Primary  ? Relevant Orders  ? POCT URINALYSIS DIP (CLINITEK) (Completed)  ? CBC with Differential/Platelet (Completed)  ? Comprehensive metabolic panel (Completed)  ? Lipase (Completed)  ? Left lower quadrant abdominal pain  ?  Nonspecific abdominal pain.  Located in the left lower quadrant that does radiate down to inguinal area and groin per patient report does have a history of bilateral inguinal hernia repairs with mesh placement.  States it feels similar to after the procedure when his mesh was "pulling".  Did discuss different options and agreed to pursue CT scan of abdomen pelvis with contrast.  To rule out  mesh complications and possible new hernia.  Although hernia was not palpated on exam ? ?  ?  ? Relevant Orders  ? CT Abdomen Pelvis W Contrast  ? History of hernia repair  ?  History of bilateral inguinal hernia repair with mesh placement.  States it was performed at [facility] surgery [facility].  Patient was seen in urgent care approximate 2 weeks ago without improvement of abdominal pain concern for mesh involvement as he states that his mesh is currently recalled.  Pending CT scan ? ?  ?  ? Relevant Orders  ? CT Abdomen Pelvis W Contrast  ? ?Other Visit Diagnoses   ? ? Screening for colon cancer      ? Relevant Orders  ? Ambulatory referral to Gastroenterology  ? ?  ? ? ?Outpatient Encounter Medications as of 12/21/2021  ?Medication Sig  ? clotrimazole-betamethasone (LOTRISONE) cream Apply 1 application topically 2 (two) times daily. (Patient not taking: Reported on 11/20/2021)  ? ?No facility-administered encounter medications on file as of 12/21/2021.  ? ? ?Follow-up: Return in about 6 months (around  06/22/2022) for CPE.  ? ?Audria Nine, NP ? ?

## 2021-12-25 ENCOUNTER — Ambulatory Visit
Admission: RE | Admit: 2021-12-25 | Discharge: 2021-12-25 | Disposition: A | Discharge status: Home / Self Care | Payer: BC Managed Care – PPO | Source: Ambulatory Visit | Attending: Nurse Practitioner | Admitting: Nurse Practitioner

## 2021-12-25 DIAGNOSIS — D7389 Other diseases of spleen: Secondary | ICD-10-CM | POA: Diagnosis not present

## 2021-12-25 DIAGNOSIS — Z8719 Personal history of other diseases of the digestive system: Secondary | ICD-10-CM

## 2021-12-25 DIAGNOSIS — R1032 Left lower quadrant pain: Secondary | ICD-10-CM

## 2021-12-25 DIAGNOSIS — N200 Calculus of kidney: Secondary | ICD-10-CM | POA: Diagnosis not present

## 2021-12-25 DIAGNOSIS — M47817 Spondylosis without myelopathy or radiculopathy, lumbosacral region: Secondary | ICD-10-CM | POA: Diagnosis not present

## 2021-12-25 DIAGNOSIS — M47816 Spondylosis without myelopathy or radiculopathy, lumbar region: Secondary | ICD-10-CM | POA: Diagnosis not present

## 2021-12-25 MED ORDER — IOPAMIDOL (ISOVUE-300) INJECTION 61%
100.0000 mL | Freq: Once | INTRAVENOUS | Status: AC | PRN
  Administered 2021-12-25: 100 mL via INTRAVENOUS

## 2021-12-29 ENCOUNTER — Encounter: Payer: Self-pay | Admitting: Nurse Practitioner

## 2021-12-30 ENCOUNTER — Telehealth: Payer: Self-pay | Admitting: Nurse Practitioner

## 2021-12-30 NOTE — Telephone Encounter (Signed)
Since he is still having some discomfort and we got the CT scan would he like to try a muscle relaxer to see if it is muscular in nature? ?

## 2021-12-30 NOTE — Telephone Encounter (Signed)
Patient advised. Patient will hold off on the medication since its not a severe discomfort as it was before and will keep [place] updated if that changes ?

## 2021-12-30 NOTE — Telephone Encounter (Signed)
-----   Message from Annabell Sabal, [place] sent at 12/29/2021  4:49 PM EDT ----- ?Called and spoke with patient he said that he has review his results from his my chart acct. He is going to reach out to the GI office regarding his referral and he is still having some discomfort , that hasn't gotten worse be he know that it is there. ?

## 2022-01-26 NOTE — Telephone Encounter (Signed)
Placed referral closing encounter.   Patient states he will call when ready

## 2022-01-26 NOTE — Telephone Encounter (Signed)
Attempted to schedule.  

## 2022-03-02 ENCOUNTER — Encounter: Payer: Self-pay | Admitting: Gastroenterology

## 2022-03-12 ENCOUNTER — Ambulatory Visit (AMBULATORY_SURGERY_CENTER): Payer: Self-pay | Admitting: *Deleted

## 2022-03-12 VITALS — Ht 74.0 in | Wt 221.0 lb

## 2022-03-12 DIAGNOSIS — Z1211 Encounter for screening for malignant neoplasm of colon: Secondary | ICD-10-CM

## 2022-03-12 MED ORDER — NA SULFATE-K SULFATE-MG SULF 17.5-3.13-1.6 GM/177ML PO SOLN: 1.0000 | Freq: Once | ORAL | 0 refills | Status: AC

## 2022-03-12 NOTE — Progress Notes (Signed)
No egg or soy allergy known to patient  No issues known to pt with past sedation with any surgeries or procedures Patient denies ever being told they had issues or difficulty with intubation  No FH of Malignant Hyperthermia Pt is not on diet pills Pt is not on  home 02  Pt is not on blood thinners  Pt denies issues with constipation  No A fib or A flutter Have any cardiac testing pending--no Pt instructed to use [email or web address] or GoodRx for a price reduction on prep   

## 2022-03-31 ENCOUNTER — Encounter: Payer: Self-pay | Admitting: Gastroenterology

## 2022-04-05 ENCOUNTER — Encounter: Payer: Self-pay | Admitting: Gastroenterology

## 2022-04-05 ENCOUNTER — Ambulatory Visit (AMBULATORY_SURGERY_CENTER): Payer: BC Managed Care – PPO | Admitting: Gastroenterology

## 2022-04-05 VITALS — BP 121/83 | HR 61 | Temp 98.4°F | Resp 18 | Ht 74.0 in | Wt 221.0 lb

## 2022-04-05 DIAGNOSIS — Z1211 Encounter for screening for malignant neoplasm of colon: Secondary | ICD-10-CM | POA: Diagnosis not present

## 2022-04-05 NOTE — Op Note (Signed)
[facility] Patient Name: Devon Lewis Procedure Date: 04/05/2022 10:47 AM MRN: [id number] Endoscopist: Viviann Spare P. Adela Lank , MD Age: 46 Referring MD:  Date of Birth: 07/08/1976 Gender: Male Account #: [id number] Procedure:                Colonoscopy Indications:              Screening for colorectal malignant neoplasm, This                            is the patient's first colonoscopy Medicines:                Monitored Anesthesia Care Procedure:                Pre-Anesthesia Assessment:                           - Prior to the procedure, a History and Physical                            was performed, and patient medications and                            allergies were reviewed. The patient's tolerance of                            previous anesthesia was also reviewed. The risks                            and benefits of the procedure and the sedation                            options and risks were discussed with the patient.                            All questions were answered, and informed consent                            was obtained. Prior Anticoagulants: The patient has                            taken no previous anticoagulant or antiplatelet                            agents. ASA Grade Assessment: II - A patient with                            mild systemic disease. After reviewing the risks                            and benefits, the patient was deemed in                            satisfactory condition to undergo the procedure.  After obtaining informed consent, the colonoscope                            was passed under direct vision. Throughout the                            procedure, the patient's blood pressure, pulse, and                            oxygen saturations were monitored continuously. The                            CF [id number] #[id number] was introduced through the anus                            and advanced to the  the terminal ileum, with                            identification of the appendiceal orifice and IC                            valve. The colonoscopy was performed without                            difficulty. The patient tolerated the procedure                            well. The quality of the bowel preparation was                            adequate. The ileocecal valve, appendiceal orifice,                            and rectum were photographed. Scope In: 10:51:01 AM Scope Out: 11:12:49 AM Scope Withdrawal Time: 0 hours 18 minutes 18 seconds  Total Procedure Duration: 0 hours 21 minutes 48 seconds  Findings:                 The perianal and digital rectal examinations were                            normal.                           The terminal ileum appeared normal.                           A large amount of semi-liquid stool was found in                            the entire colon, making visualization difficult -                            worst in the left colon. Lavage of the colon was  performed using copious amounts of sterile water,                            resulting in clearance with adequate visualization.                            This prolonged the exam.                           Internal hemorrhoids were found during retroflexion                            with stigmata of prior banding. The hemorrhoids                            were small.                           The exam was otherwise without abnormality. Complications:            No immediate complications. Estimated blood loss:                            None. Estimated Blood Loss:     Estimated blood loss: none. Impression:               - The examined portion of the ileum was normal.                           - Stool in the entire examined colon which led to                            extensive lavage but adequate views obtained                           - Internal hemorrhoids  with stigmata of prior                            banding                           - The examination was otherwise normal.                           - No polyps. Recommendation:           - Patient has a contact number available for                            emergencies. The signs and symptoms of potential                            delayed complications were discussed with the                            patient. Return to normal activities tomorrow.  Written discharge instructions were provided to the                            patient.                           - Resume previous diet.                           - Continue present medications.                           - Repeat colonoscopy in 10 years for screening                            purposes Nixxon Faria P. Synda Bagent, MD 04/05/2022 11:18:02 AM This report has been signed electronically.

## 2022-04-05 NOTE — Progress Notes (Signed)
[facility] History and Physical   Primary Care Physician:  [name], NP   Reason for Procedure:   Colon cancer screening  Plan:    colonoscopy     HPI: Devon Lewis is a 46 y.o. male  here for colonoscopy screening - first time exam. Patient denies any bowel symptoms at this time. No family history of colon cancer known. Otherwise feels well without any cardiopulmonary symptoms.   I have discussed risks / benefits of anesthesia and endoscopic procedure with Devon Lewis and they wish to proceed with the exams as outlined today.    Past Medical History:  Diagnosis Date   Chest tightness 10/21/2015   Kidney stones    april 2023   Sensation of lump in throat 10/21/2015    Past Surgical History:  Procedure Laterality Date   HERNIA REPAIR     over 13 years ago   KNEE SURGERY Left    meniscus repair 18-20 years ago    Prior to Admission medications   Medication Sig Start Date End Date Taking? Authorizing Provider  Fiber Adult Gummies 2 g CHEW Chew by mouth daily.   Yes [provider]  clotrimazole-betamethasone (LOTRISONE) cream Apply 1 application topically 2 (two) times daily. Patient not taking: Reported on 11/20/2021 11/10/18   Toney Reil, MD    Current Outpatient Medications  Medication Sig Dispense Refill   Fiber Adult Gummies 2 g CHEW Chew by mouth daily.     clotrimazole-betamethasone (LOTRISONE) cream Apply 1 application topically 2 (two) times daily. (Patient not taking: Reported on 11/20/2021) 15 g 1   No current facility-administered medications for this visit.    Allergies as of 04/05/2022   (No Known Allergies)    Family History  Problem Relation Age of Onset   Heart attack Mother    Heart attack Brother    Colon cancer Neg Hx    Colon polyps Neg Hx    Crohn's disease Neg Hx    Esophageal cancer Neg Hx    Stomach cancer Neg Hx    Rectal cancer Neg Hx     Social History   Socioeconomic History   Marital  status: Married    Spouse name: Not on file   Number of children: 2   Years of education: Not on file   Highest education level: Not on file  Occupational History   Not on file  Tobacco Use   Smoking status: Never    Passive exposure: Past   Smokeless tobacco: Never  Vaping Use   Vaping Use: Never used  Substance and Sexual Activity   Alcohol use: Yes    Comment: very rarely   Drug use: No   Sexual activity: Not on file  Other Topics Concern   Not on file  Social History Narrative   Fulltime: Education administrator   Social Determinants of Health   Financial Resource Strain: Not on file  Food Insecurity: Not on file  Transportation Needs: Not on file  Physical Activity: Not on file  Stress: Not on file  Social Connections: Not on file  Intimate Partner Violence: Not on file    Review of Systems: All other review of systems negative except as mentioned in the HPI.  Physical Exam: Vital signs BP 104/62 (BP Location: Right Arm, Patient Position: Sitting, Cuff Size: Normal)   Pulse 68   Temp 98.4 F (36.9 C) (Temporal)   Ht 6\' 2"  (1.88 m)   Wt 221 lb (100.2 kg)  SpO2 (!) 8%   BMI 28.37 kg/m   General:   Alert,  Well-developed, pleasant and cooperative in NAD Lungs:  Clear throughout to auscultation.   Heart:  Regular rate and rhythm Abdomen:  Soft, nontender and nondistended.   Neuro/Psych:  Alert and cooperative. Normal mood and affect. A and O x 3  Harlin Rain, MD [facility]

## 2022-04-05 NOTE — Progress Notes (Signed)
Sedate, gd SR, tolerated procedure well, VSS, report to RN 

## 2022-04-05 NOTE — Patient Instructions (Signed)
Resume previous diet and continue present medications. Repeat colonoscopy for surveillance in 10 years!  YOU HAD AN ENDOSCOPIC PROCEDURE TODAY AT THE [facility]:   Refer to the procedure report that was given to you for any specific questions about what was found during the examination.  If the procedure report does not answer your questions, please call your gastroenterologist to clarify.  If you requested that your care partner not be given the details of your procedure findings, then the procedure report has been included in a sealed envelope for you to review at your convenience later.  YOU SHOULD EXPECT: Some feelings of bloating in the abdomen. Passage of more gas than usual.  Walking can help get rid of the air that was put into your GI tract during the procedure and reduce the bloating. If you had a lower endoscopy (such as a colonoscopy or flexible sigmoidoscopy) you may notice spotting of blood in your stool or on the toilet paper. If you underwent a bowel prep for your procedure, you may not have a normal bowel movement for a few days.  Please Note:  You might notice some irritation and congestion in your nose or some drainage.  This is from the oxygen used during your procedure.  There is no need for concern and it should clear up in a day or so.  SYMPTOMS TO REPORT IMMEDIATELY:  Following lower endoscopy (colonoscopy or flexible sigmoidoscopy):  Excessive amounts of blood in the stool  Significant tenderness or worsening of abdominal pains  Swelling of the abdomen that is new, acute  Fever of 100F or higher  For urgent or emergent issues, a gastroenterologist can be reached at any hour by calling [phone]. Do not use MyChart messaging for urgent concerns.    DIET:  We do recommend a small meal at first, but then you may proceed to your regular diet.  Drink plenty of fluids but you should avoid alcoholic beverages for 24 hours.  ACTIVITY:  You should plan to  take it easy for the rest of today and you should NOT DRIVE or use heavy machinery until tomorrow (because of the sedation medicines used during the test).    FOLLOW UP: Our staff will call the number listed on your records the next business day following your procedure.  We will call around 7:15- 8:00 am to check on you and address any questions or concerns that you may have regarding the information given to you following your procedure. If we do not reach you, we will leave a message.  If you develop any symptoms (ie: fever, flu-like symptoms, shortness of breath, cough etc.) before then, please call [phone].  If you test positive for Covid 19 in the 2 weeks post procedure, please call and report this information to us.    If any biopsies were taken you will be contacted by phone or by letter within the next 1-3 weeks.  Please call us at [phone] if you have not heard about the biopsies in 3 weeks.    SIGNATURES/CONFIDENTIALITY: You and/or your care partner have signed paperwork which will be entered into your electronic medical record.  These signatures attest to the fact that that the information above on your After Visit Summary has been reviewed and is understood.  Full responsibility of the confidentiality of this discharge information lies with you and/or your care-partner.  

## 2022-04-06 ENCOUNTER — Telehealth: Payer: Self-pay

## 2022-04-06 NOTE — Telephone Encounter (Signed)
  Follow up Call-     04/05/2022    9:44 AM 04/05/2022    9:39 AM  Call back number  Post procedure Call Back phone  # [phone]   Permission to leave phone message  Yes     Patient questions:  Do you have a fever, pain , or abdominal swelling? No. Pain Score  0 *  Have you tolerated food without any problems? Yes.    Have you been able to return to your normal activities? Yes.    Do you have any questions about your discharge instructions: Diet   No. Medications  No. Follow up visit  No.  Do you have questions or concerns about your Care? No.  Actions: * If pain score is 4 or above: No action needed, pain <4.

## 2022-04-22 ENCOUNTER — Ambulatory Visit: Payer: 59 | Admitting: Internal Medicine

## 2022-06-22 ENCOUNTER — Ambulatory Visit (INDEPENDENT_AMBULATORY_CARE_PROVIDER_SITE_OTHER): Payer: BC Managed Care – PPO | Admitting: Nurse Practitioner

## 2022-06-22 ENCOUNTER — Encounter: Payer: Self-pay | Admitting: Nurse Practitioner

## 2022-06-22 VITALS — BP 118/62 | HR 60 | Temp 96.6°F | Ht 74.0 in | Wt 225.0 lb

## 2022-06-22 DIAGNOSIS — Z1159 Encounter for screening for other viral diseases: Secondary | ICD-10-CM

## 2022-06-22 DIAGNOSIS — E663 Overweight: Secondary | ICD-10-CM | POA: Insufficient documentation

## 2022-06-22 DIAGNOSIS — R1084 Generalized abdominal pain: Secondary | ICD-10-CM

## 2022-06-22 DIAGNOSIS — Z0000 Encounter for general adult medical examination without abnormal findings: Secondary | ICD-10-CM | POA: Insufficient documentation

## 2022-06-22 DIAGNOSIS — Z114 Encounter for screening for human immunodeficiency virus [HIV]: Secondary | ICD-10-CM

## 2022-06-22 DIAGNOSIS — Z23 Encounter for immunization: Secondary | ICD-10-CM | POA: Diagnosis not present

## 2022-06-22 DIAGNOSIS — E7800 Pure hypercholesterolemia, unspecified: Secondary | ICD-10-CM

## 2022-06-22 LAB — COMPREHENSIVE METABOLIC PANEL
ALT: 20 U/L (ref 0–53)
AST: 15 U/L (ref 0–37)
Albumin: 4.4 g/dL (ref 3.5–5.2)
Alkaline Phosphatase: 73 U/L (ref 39–117)
BUN: 14 mg/dL (ref 6–23)
CO2: 33 mEq/L — ABNORMAL HIGH (ref 19–32)
Calcium: 9.4 mg/dL (ref 8.4–10.5)
Chloride: 102 mEq/L (ref 96–112)
Creatinine, Ser: 1.1 mg/dL (ref 0.40–1.50)
GFR: 80.44 mL/min (ref >60.00)
Glucose, Bld: 91 mg/dL (ref 70–99)
Potassium: 4.5 mEq/L (ref 3.5–5.1)
Sodium: 139 mEq/L (ref 135–145)
Total Bilirubin: 0.5 mg/dL (ref 0.2–1.2)
Total Protein: 7.1 g/dL (ref 6.0–8.3)

## 2022-06-22 LAB — LIPID PANEL
Cholesterol: 197 mg/dL (ref 0–200)
HDL: 40.8 mg/dL (ref >39.00)
LDL Cholesterol: 125 mg/dL — ABNORMAL HIGH (ref 0–99)
NonHDL: 156.24
Total CHOL/HDL Ratio: 5
Triglycerides: 154 mg/dL — ABNORMAL HIGH (ref 0.0–149.0)
VLDL: 30.8 mg/dL (ref 0.0–40.0)

## 2022-06-22 LAB — CBC
HCT: 46.9 % (ref 39.0–52.0)
Hemoglobin: 16.1 g/dL (ref 13.0–17.0)
MCHC: 34.3 g/dL (ref 30.0–36.0)
MCV: 88.3 fl (ref 78.0–100.0)
Platelets: 258 10*3/uL (ref 150.0–400.0)
RBC: 5.31 Mil/uL (ref 4.22–5.81)
RDW: 12.8 % (ref 11.5–15.5)
WBC: 7.3 10*3/uL (ref 4.0–10.5)

## 2022-06-22 LAB — TSH: TSH: 2.05 u[IU]/mL (ref 0.35–5.50)

## 2022-06-22 LAB — HEMOGLOBIN A1C: Hgb A1c MFr Bld: 5.4 % (ref 4.6–6.5)

## 2022-06-22 NOTE — Patient Instructions (Signed)
Nice to see you today I will be in touch with the labs once I have them Follow up with me in 1 year for your next physical, sooner if you need me  It is recommended that you get 30 mins of exercise 5 times a week Work on cutting back on the amount of tea and soda that you consume and work on getting more water in

## 2022-06-22 NOTE — Assessment & Plan Note (Signed)
Discussed age-appropriate immunizations and screening exams.  Did give a print off of preventative health maintenance at discharge that has anticipatory guidance and recommendations.

## 2022-06-22 NOTE — Assessment & Plan Note (Signed)
Pending labs today. 

## 2022-06-22 NOTE — Assessment & Plan Note (Signed)
Patient did have a CT of the abdomen and pelvis did not show any acute abnormality.  He also followed up with gastro and a colonoscopy was performed that was negative.  Likely could be musculoskeletal

## 2022-06-22 NOTE — Progress Notes (Signed)
Established Patient Office Visit  Subjective   Patient ID: Devon Lewis, male    DOB: 1976-03-26  Age: 46 y.o. MRN: [id number]  Chief Complaint  Patient presents with   Annual Exam    Not fasting     HPI  for complete physical and follow up of chronic conditions.  Immunizations: -Tetanus: update today -Influenza: refused -Shingles: Too young -Pneumonia: Too young - Covid: refused  -HPV: Aged out  Diet: [place]. 3 meals a day. Does snack sometimes. States he does sweet rea and some mtn dew.  Very little water. Does drink more in the summer.  Exercise: No regular exercise.  Eye exam: PRN Dental exam: Completes semi-annually   Colonoscopy: Completed in 2023 with Dr. Havery Moros Lung Cancer Screening: N/A Dexa: N/A  PSA: Too young, currently average risk  Sleep: 1030-11 and gets up aorund 645. Feels rested most of the time. States that lately dog is sick and is up and down a little surface sleep. Does snore.   Review of Systems  Constitutional:  Negative for chills and fever.  Respiratory:  Negative for shortness of breath.   Cardiovascular:  Negative for chest pain (from his back pain).  Gastrointestinal:  Negative for abdominal pain, blood in stool, constipation, diarrhea, nausea and vomiting.       BM daily at least  Genitourinary:  Negative for dysuria and hematuria.  Neurological:  Negative for tingling and headaches.  Psychiatric/Behavioral:  Negative for hallucinations and suicidal ideas.       Objective:     BP 118/62   Pulse 60   Temp (!) 96.6 F (35.9 C) (Temporal)   Ht 6\' 2"  (1.88 m)   Wt 225 lb (102.1 kg)   SpO2 99%   BMI 28.89 kg/m    Physical Exam Vitals and nursing note reviewed. Exam conducted with a chaperone present [facility] [place], RMA).  Constitutional:      Appearance: Normal appearance.  HENT:     Right Ear: Tympanic membrane, ear canal and external ear normal.     Left Ear: Tympanic membrane, ear canal and external ear  normal.     Mouth/Throat:     Mouth: Mucous membranes are moist.     Pharynx: Oropharynx is clear.  Eyes:     Extraocular Movements: Extraocular movements intact.     Pupils: Pupils are equal, round, and reactive to light.  Cardiovascular:     Rate and Rhythm: Normal rate and regular rhythm.     Heart sounds: Normal heart sounds.  Pulmonary:     Effort: Pulmonary effort is normal.     Breath sounds: Normal breath sounds.  Abdominal:     General: Bowel sounds are normal. There is no distension.     Palpations: There is no mass.     Tenderness: There is no abdominal tenderness.     Hernia: No hernia is present. There is no hernia in the left inguinal area or right inguinal area.  Genitourinary:    Penis: Normal.      Testes: Normal.     Epididymis:     Right: Normal.     Left: Normal.  Musculoskeletal:     Right lower leg: No edema.     Left lower leg: No edema.  Lymphadenopathy:     Cervical: No cervical adenopathy.     Lower Body: No right inguinal adenopathy. No left inguinal adenopathy.  Skin:    General: Skin is warm.  Neurological:  General: No focal deficit present.     Mental Status: He is alert.     Comments: Bilateral upper and lower extremity strength 5/5  Psychiatric:        Mood and Affect: Mood normal.        Behavior: Behavior normal.        Thought Content: Thought content normal.        Judgment: Judgment normal.      No results found for any visits on 06/22/22.    The ASCVD Risk score (Arnett DK, et al., 2019) failed to calculate for the following reasons:   Cannot find a previous HDL lab   Cannot find a previous total cholesterol lab    Assessment & Plan:   Problem List Items Addressed This Visit       Other   Mild hypercholesterolemia    Pending labs today.      Generalized abdominal pain    Patient did have a CT of the abdomen and pelvis did not show any acute abnormality.  He also followed up with gastro and a colonoscopy was  performed that was negative.  Likely could be musculoskeletal      Preventative health care - Primary    Discussed age-appropriate immunizations and screening exams.  Did give a print off of preventative health maintenance at discharge that has anticipatory guidance and recommendations.      Relevant Orders   CBC   Comprehensive metabolic panel   Hemoglobin A1c   TSH   Lipid panel   Overweight    Encouraged healthy lifestyle modifications in regards to exercise of 30 minutes a day 5 times a week.      Relevant Orders   Hemoglobin A1c   Lipid panel   Other Visit Diagnoses     Encounter for screening for HIV       Relevant Orders   HIV antibody (with reflex)   Need for hepatitis C screening test       Relevant Orders   Hepatitis C Antibody   Need for Tdap vaccination       Relevant Orders   Tdap vaccine greater than or equal to 7yo IM (Completed)       Return in about 1 year (around 06/23/2023) for CPE and labs.    Romilda Garret, NP

## 2022-06-22 NOTE — Assessment & Plan Note (Signed)
Encouraged healthy lifestyle modifications in regards to exercise of 30 minutes a day 5 times a week.

## 2022-06-23 LAB — HIV ANTIBODY (ROUTINE TESTING W REFLEX): HIV 1&2 Ab, 4th Generation: NONREACTIVE

## 2022-06-23 LAB — HEPATITIS C ANTIBODY: Hepatitis C Ab: NONREACTIVE

## 2022-06-29 DIAGNOSIS — M9905 Segmental and somatic dysfunction of pelvic region: Secondary | ICD-10-CM | POA: Diagnosis not present

## 2022-06-29 DIAGNOSIS — M9903 Segmental and somatic dysfunction of lumbar region: Secondary | ICD-10-CM | POA: Diagnosis not present

## 2022-06-29 DIAGNOSIS — M5416 Radiculopathy, lumbar region: Secondary | ICD-10-CM | POA: Diagnosis not present

## 2022-06-29 DIAGNOSIS — M9904 Segmental and somatic dysfunction of sacral region: Secondary | ICD-10-CM | POA: Diagnosis not present

## 2022-07-06 DIAGNOSIS — M9905 Segmental and somatic dysfunction of pelvic region: Secondary | ICD-10-CM | POA: Diagnosis not present

## 2022-07-06 DIAGNOSIS — M9904 Segmental and somatic dysfunction of sacral region: Secondary | ICD-10-CM | POA: Diagnosis not present

## 2022-07-06 DIAGNOSIS — M9903 Segmental and somatic dysfunction of lumbar region: Secondary | ICD-10-CM | POA: Diagnosis not present

## 2022-07-06 DIAGNOSIS — M5416 Radiculopathy, lumbar region: Secondary | ICD-10-CM | POA: Diagnosis not present

## 2022-07-08 ENCOUNTER — Ambulatory Visit (INDEPENDENT_AMBULATORY_CARE_PROVIDER_SITE_OTHER)
Admission: RE | Admit: 2022-07-08 | Discharge: 2022-07-08 | Disposition: A | Discharge status: Home / Self Care | Payer: BC Managed Care – PPO | Source: Ambulatory Visit | Attending: Nurse Practitioner | Admitting: Nurse Practitioner

## 2022-07-08 ENCOUNTER — Ambulatory Visit: Payer: BC Managed Care – PPO | Admitting: Nurse Practitioner

## 2022-07-08 ENCOUNTER — Encounter: Payer: Self-pay | Admitting: Nurse Practitioner

## 2022-07-08 VITALS — BP 116/82 | HR 64 | Temp 97.1°F | Resp 16 | Ht 74.0 in | Wt 224.1 lb

## 2022-07-08 DIAGNOSIS — R0982 Postnasal drip: Secondary | ICD-10-CM | POA: Diagnosis not present

## 2022-07-08 DIAGNOSIS — R0789 Other chest pain: Secondary | ICD-10-CM

## 2022-07-08 MED ORDER — OMEPRAZOLE 20 MG PO CPDR: 20.0000 mg | DELAYED_RELEASE_CAPSULE | Freq: Every day | ORAL | 0 refills | Status: DC

## 2022-07-08 MED ORDER — FLUTICASONE PROPIONATE 50 MCG/ACT NA SUSP
2.0000 | Freq: Every day | NASAL | 0 refills | Status: DC
Start: 2022-07-08 — End: 2023-02-17

## 2022-07-08 NOTE — Assessment & Plan Note (Signed)
Ambiguous in nature.  Patient does have family history of mother with heart attack and half brother on that side with heart attack.  Not related to activity not truly chest pain.  Does sound like an atypical GERD.  We will treat patient with omeprazole 20 mg daily for 1 month.  Obtain chest x-ray, pending result.

## 2022-07-08 NOTE — Assessment & Plan Note (Signed)
Patient experiencing what seems to be a postnasal drip.  We will treat with Flonase 2 sprays each nostril daily.  Epistaxis precautions reviewed

## 2022-07-08 NOTE — Patient Instructions (Signed)
Nice to see you today I will be in touch with the xray once I have the results We will try the acid reducing medication for a month Give it approx 10 days to see if you notice a difference

## 2022-07-08 NOTE — Progress Notes (Signed)
Acute Office Visit  Subjective:     Patient ID: Devon Lewis, male    DOB: 01-19-1976, 46 y.o.   MRN: [id number]  Chief Complaint  Patient presents with   Chest congestion    X 3 weeks-Congestion in the mid chest area, has to clear his throat around 10 am daily, post nasal drip. Has had a couple of episodes of sweating in the last 3 weeks. No fever. Has tried Claritin, Zyrtec, generic OTC mucus congestion medication. Started around when the weather started to change.     HPI Patient is in today for Chest congestion   States symptoms started approx 3 weeks ago States that he has tried claritin, zytrec, and otc mucous medication with little relief.  States that it is in the center of his chest "almost like a knot" states that he has to clear his throat around 1030am every morning.  He states he does have a burning sensation after he clears his throat and is good for the rest the day.  He does have some sort of breakfast every day but that is generally around 7:00 in the morning. States that food and fluid do not make a difference in the discomfort.  States breathing deep actually helps relieve the discomfort.  Not related to any activity.  States that he did try over-the-counter heartburn medications they have at work without relief  No sick contacts.    Review of Systems  Constitutional:  Positive for chills. Negative for fever.  HENT:  Negative for congestion, ear discharge, ear pain and sore throat.        + PND  Respiratory:  Negative for shortness of breath.   Cardiovascular:  Negative for chest pain.  Gastrointestinal:  Negative for abdominal pain, nausea and vomiting.        Objective:    BP 116/82   Pulse 64   Temp (!) 97.1 F (36.2 C) (Temporal)   Resp 16   Ht 6\' 2"  (1.88 m)   Wt 224 lb 2 oz (101.7 kg)   SpO2 98%   BMI 28.78 kg/m    Physical Exam Vitals and nursing note reviewed.  Constitutional:      Appearance: Normal appearance.  HENT:     Right  Ear: Tympanic membrane, ear canal and external ear normal.     Left Ear: Tympanic membrane, ear canal and external ear normal.     Mouth/Throat:     Mouth: Mucous membranes are moist.     Pharynx: Oropharynx is clear.  Cardiovascular:     Rate and Rhythm: Normal rate and regular rhythm.     Heart sounds: Normal heart sounds.  Pulmonary:     Effort: Pulmonary effort is normal.     Breath sounds: Normal breath sounds.  Chest:     Chest wall: No tenderness.  Abdominal:     General: Bowel sounds are normal. There is no distension.     Palpations: There is no mass.     Tenderness: There is no abdominal tenderness.     Hernia: No hernia is present.  Lymphadenopathy:     Cervical: No cervical adenopathy.  Neurological:     Mental Status: He is alert.     No results found for any visits on 07/08/22.      Assessment & Plan:   Problem List Items Addressed This Visit       Other   Chest discomfort    Ambiguous in nature.  Patient does have  family history of mother with heart attack and half brother on that side with heart attack.  Not related to activity not truly chest pain.  Does sound like an atypical GERD.  We will treat patient with omeprazole 20 mg daily for 1 month.  Obtain chest x-ray, pending result.      Relevant Medications   omeprazole (PRILOSEC) 20 MG capsule   Other Relevant Orders   DG Chest 2 View   PND (post-nasal drip) - Primary    Patient experiencing what seems to be a postnasal drip.  We will treat with Flonase 2 sprays each nostril daily.  Epistaxis precautions reviewed      Relevant Medications   fluticasone (FLONASE) 50 MCG/ACT nasal spray    Meds ordered this encounter  Medications   fluticasone (FLONASE) 50 MCG/ACT nasal spray    Sig: Place 2 sprays into both nostrils daily.    Dispense:  16 g    Refill:  0    Order Specific Question:   Supervising Provider    Answer:   Roxy Manns A [[id number]]   omeprazole (PRILOSEC) 20 MG capsule    Sig: Take  1 capsule (20 mg total) by mouth daily.    Dispense:  30 capsule    Refill:  0    Order Specific Question:   Supervising Provider    Answer:   TOWER, MARNE A [[id number]]    Return if symptoms worsen or fail to improve.  Audria Nine, NP

## 2022-07-15 ENCOUNTER — Ambulatory Visit: Payer: BC Managed Care – PPO | Admitting: Nurse Practitioner

## 2022-07-20 DIAGNOSIS — M9903 Segmental and somatic dysfunction of lumbar region: Secondary | ICD-10-CM | POA: Diagnosis not present

## 2022-07-20 DIAGNOSIS — M9904 Segmental and somatic dysfunction of sacral region: Secondary | ICD-10-CM | POA: Diagnosis not present

## 2022-07-20 DIAGNOSIS — M9905 Segmental and somatic dysfunction of pelvic region: Secondary | ICD-10-CM | POA: Diagnosis not present

## 2022-07-20 DIAGNOSIS — M5416 Radiculopathy, lumbar region: Secondary | ICD-10-CM | POA: Diagnosis not present

## 2022-07-30 ENCOUNTER — Other Ambulatory Visit: Payer: Self-pay | Admitting: Nurse Practitioner

## 2022-07-30 DIAGNOSIS — R0789 Other chest pain: Secondary | ICD-10-CM

## 2022-08-03 DIAGNOSIS — M9905 Segmental and somatic dysfunction of pelvic region: Secondary | ICD-10-CM | POA: Diagnosis not present

## 2022-08-03 DIAGNOSIS — M5416 Radiculopathy, lumbar region: Secondary | ICD-10-CM | POA: Diagnosis not present

## 2022-08-03 DIAGNOSIS — M9903 Segmental and somatic dysfunction of lumbar region: Secondary | ICD-10-CM | POA: Diagnosis not present

## 2022-08-03 DIAGNOSIS — M9904 Segmental and somatic dysfunction of sacral region: Secondary | ICD-10-CM | POA: Diagnosis not present

## 2022-08-12 DIAGNOSIS — M9904 Segmental and somatic dysfunction of sacral region: Secondary | ICD-10-CM | POA: Diagnosis not present

## 2022-08-12 DIAGNOSIS — M5416 Radiculopathy, lumbar region: Secondary | ICD-10-CM | POA: Diagnosis not present

## 2022-08-12 DIAGNOSIS — M9905 Segmental and somatic dysfunction of pelvic region: Secondary | ICD-10-CM | POA: Diagnosis not present

## 2022-08-12 DIAGNOSIS — M9903 Segmental and somatic dysfunction of lumbar region: Secondary | ICD-10-CM | POA: Diagnosis not present

## 2022-08-18 DIAGNOSIS — M9905 Segmental and somatic dysfunction of pelvic region: Secondary | ICD-10-CM | POA: Diagnosis not present

## 2022-08-18 DIAGNOSIS — M5416 Radiculopathy, lumbar region: Secondary | ICD-10-CM | POA: Diagnosis not present

## 2022-08-18 DIAGNOSIS — M9904 Segmental and somatic dysfunction of sacral region: Secondary | ICD-10-CM | POA: Diagnosis not present

## 2022-08-18 DIAGNOSIS — M9903 Segmental and somatic dysfunction of lumbar region: Secondary | ICD-10-CM | POA: Diagnosis not present

## 2022-09-07 DIAGNOSIS — M9903 Segmental and somatic dysfunction of lumbar region: Secondary | ICD-10-CM | POA: Diagnosis not present

## 2022-09-07 DIAGNOSIS — M9905 Segmental and somatic dysfunction of pelvic region: Secondary | ICD-10-CM | POA: Diagnosis not present

## 2022-09-07 DIAGNOSIS — M5416 Radiculopathy, lumbar region: Secondary | ICD-10-CM | POA: Diagnosis not present

## 2022-09-07 DIAGNOSIS — M9904 Segmental and somatic dysfunction of sacral region: Secondary | ICD-10-CM | POA: Diagnosis not present

## 2022-09-28 DIAGNOSIS — M9904 Segmental and somatic dysfunction of sacral region: Secondary | ICD-10-CM | POA: Diagnosis not present

## 2022-09-28 DIAGNOSIS — M9903 Segmental and somatic dysfunction of lumbar region: Secondary | ICD-10-CM | POA: Diagnosis not present

## 2022-09-28 DIAGNOSIS — M5416 Radiculopathy, lumbar region: Secondary | ICD-10-CM | POA: Diagnosis not present

## 2022-09-28 DIAGNOSIS — M9905 Segmental and somatic dysfunction of pelvic region: Secondary | ICD-10-CM | POA: Diagnosis not present

## 2022-09-28 NOTE — Telephone Encounter (Signed)
ERROR

## 2022-10-26 DIAGNOSIS — M9905 Segmental and somatic dysfunction of pelvic region: Secondary | ICD-10-CM | POA: Diagnosis not present

## 2022-10-26 DIAGNOSIS — M9904 Segmental and somatic dysfunction of sacral region: Secondary | ICD-10-CM | POA: Diagnosis not present

## 2022-10-26 DIAGNOSIS — M9903 Segmental and somatic dysfunction of lumbar region: Secondary | ICD-10-CM | POA: Diagnosis not present

## 2022-10-26 DIAGNOSIS — M5416 Radiculopathy, lumbar region: Secondary | ICD-10-CM | POA: Diagnosis not present

## 2022-11-23 DIAGNOSIS — M9905 Segmental and somatic dysfunction of pelvic region: Secondary | ICD-10-CM | POA: Diagnosis not present

## 2022-11-23 DIAGNOSIS — M9903 Segmental and somatic dysfunction of lumbar region: Secondary | ICD-10-CM | POA: Diagnosis not present

## 2022-11-23 DIAGNOSIS — M5416 Radiculopathy, lumbar region: Secondary | ICD-10-CM | POA: Diagnosis not present

## 2022-11-23 DIAGNOSIS — M9904 Segmental and somatic dysfunction of sacral region: Secondary | ICD-10-CM | POA: Diagnosis not present

## 2022-11-29 DIAGNOSIS — M9905 Segmental and somatic dysfunction of pelvic region: Secondary | ICD-10-CM | POA: Diagnosis not present

## 2022-11-29 DIAGNOSIS — M9903 Segmental and somatic dysfunction of lumbar region: Secondary | ICD-10-CM | POA: Diagnosis not present

## 2022-11-29 DIAGNOSIS — M5416 Radiculopathy, lumbar region: Secondary | ICD-10-CM | POA: Diagnosis not present

## 2022-11-29 DIAGNOSIS — M9904 Segmental and somatic dysfunction of sacral region: Secondary | ICD-10-CM | POA: Diagnosis not present

## 2022-12-07 DIAGNOSIS — M9904 Segmental and somatic dysfunction of sacral region: Secondary | ICD-10-CM | POA: Diagnosis not present

## 2022-12-07 DIAGNOSIS — M9903 Segmental and somatic dysfunction of lumbar region: Secondary | ICD-10-CM | POA: Diagnosis not present

## 2022-12-07 DIAGNOSIS — M9905 Segmental and somatic dysfunction of pelvic region: Secondary | ICD-10-CM | POA: Diagnosis not present

## 2022-12-07 DIAGNOSIS — M5416 Radiculopathy, lumbar region: Secondary | ICD-10-CM | POA: Diagnosis not present

## 2022-12-13 IMAGING — CR DG CHEST 2V
2 series · 2 of 2 positions shown · non-contrast
Comparison: 10/21/2015

CLINICAL DATA: Chest pain

EXAM:
CHEST - 2 VIEW

[chest pa]
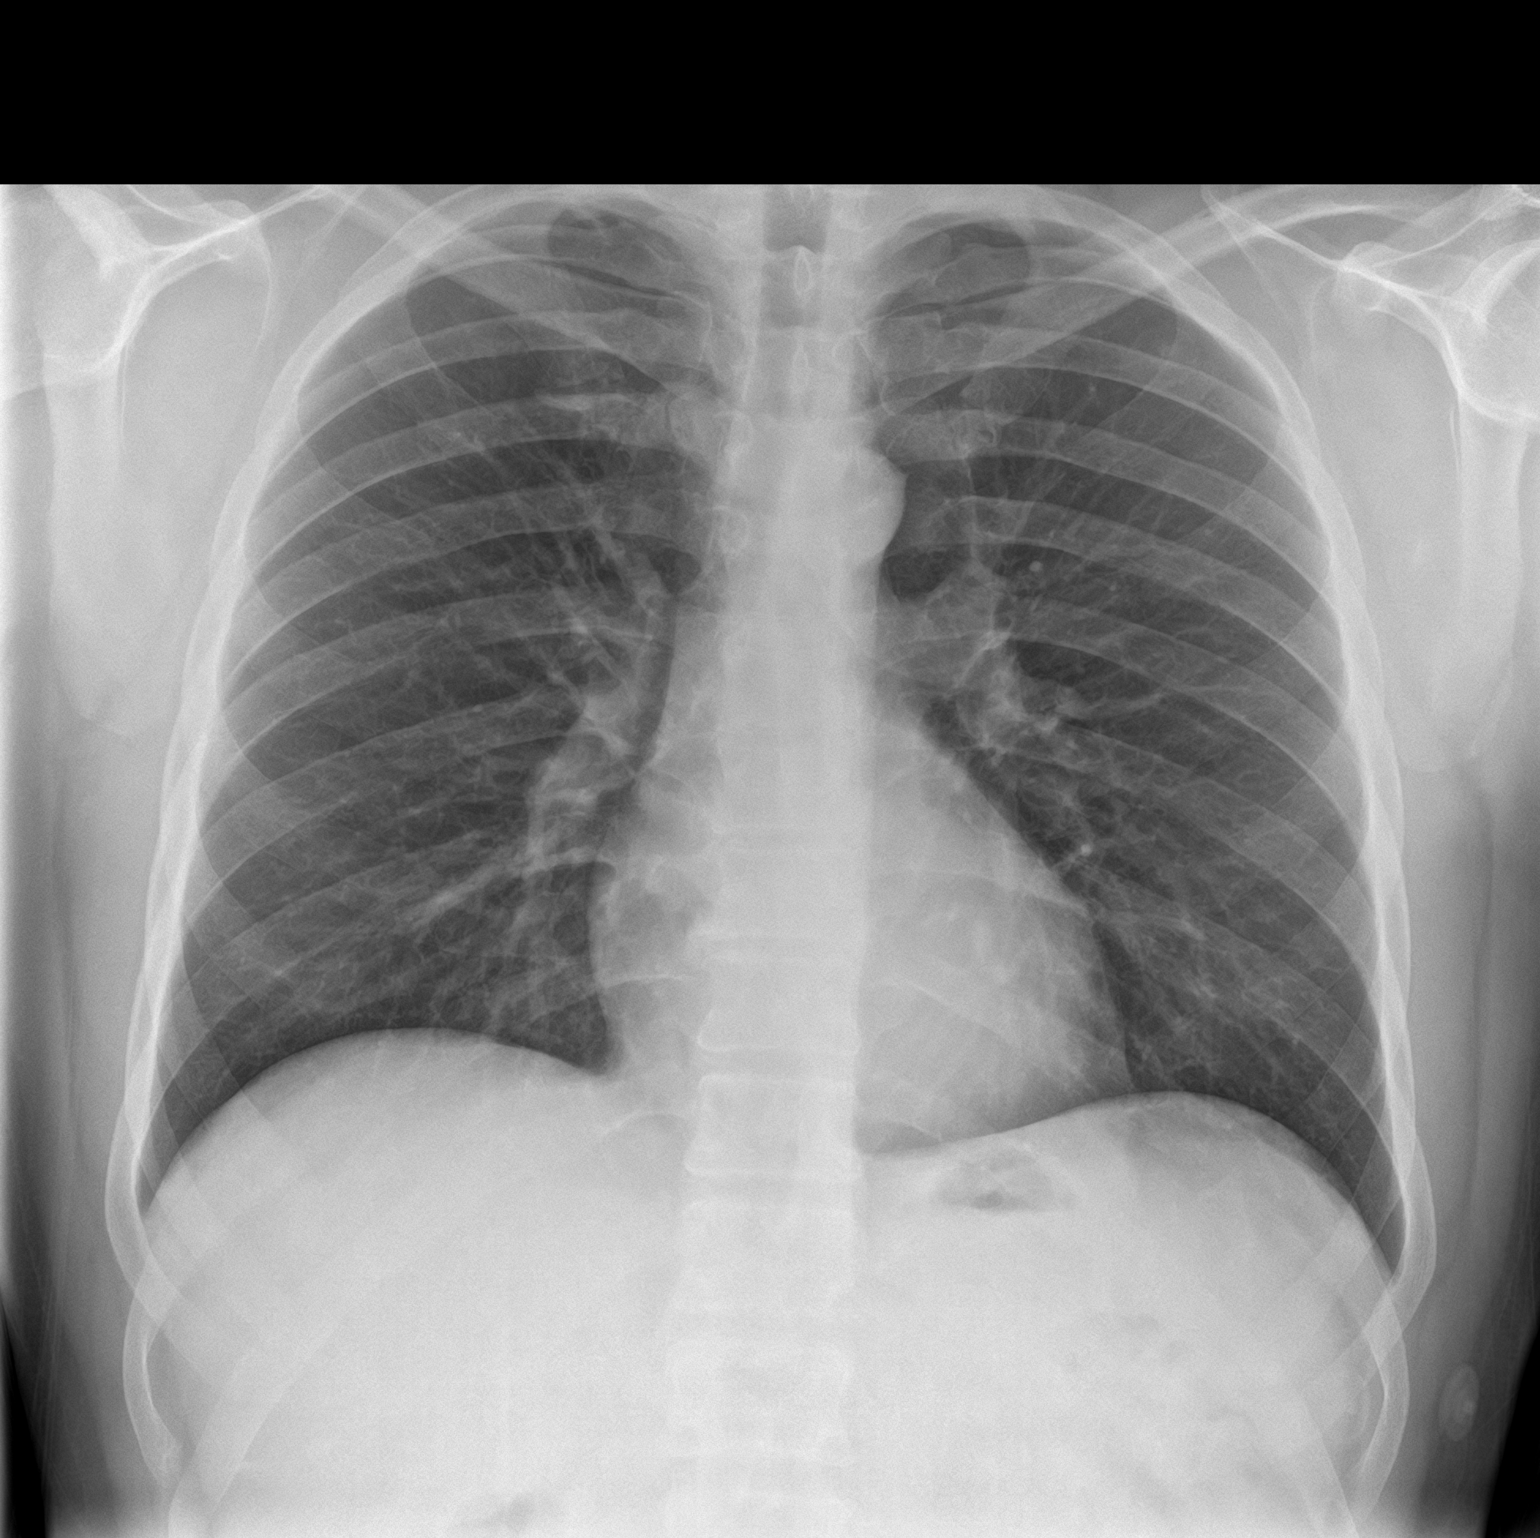

[chest lat]
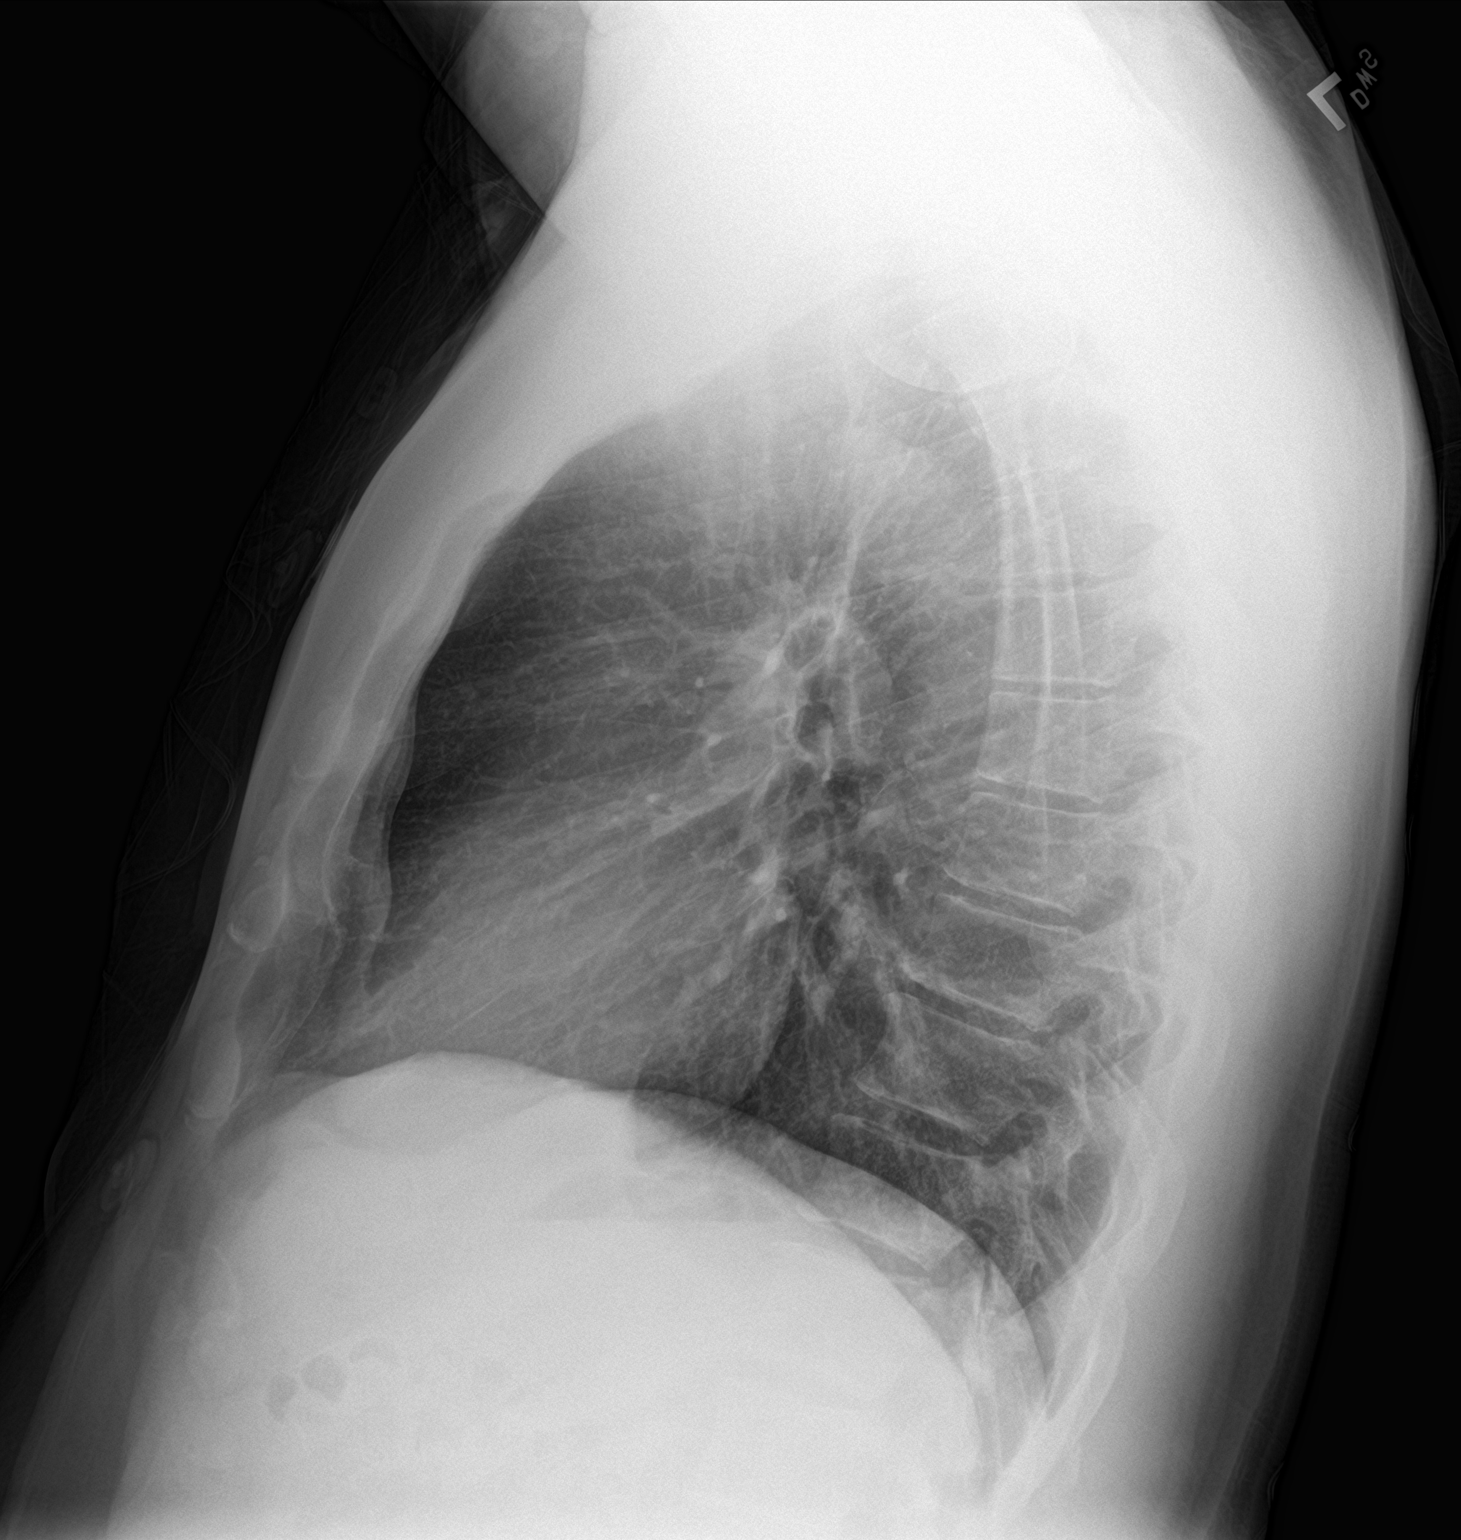

[2 of 2 positions shown; findings below may reference images not displayed]

FINDINGS: The heart size and mediastinal contours are within normal limits.
Both lungs are clear. The visualized skeletal structures are
unremarkable.
IMPRESSION: No active cardiopulmonary disease.

## 2022-12-15 DIAGNOSIS — M9905 Segmental and somatic dysfunction of pelvic region: Secondary | ICD-10-CM | POA: Diagnosis not present

## 2022-12-15 DIAGNOSIS — M9903 Segmental and somatic dysfunction of lumbar region: Secondary | ICD-10-CM | POA: Diagnosis not present

## 2022-12-15 DIAGNOSIS — M5416 Radiculopathy, lumbar region: Secondary | ICD-10-CM | POA: Diagnosis not present

## 2022-12-15 DIAGNOSIS — M9904 Segmental and somatic dysfunction of sacral region: Secondary | ICD-10-CM | POA: Diagnosis not present

## 2022-12-22 DIAGNOSIS — M9903 Segmental and somatic dysfunction of lumbar region: Secondary | ICD-10-CM | POA: Diagnosis not present

## 2022-12-22 DIAGNOSIS — M9905 Segmental and somatic dysfunction of pelvic region: Secondary | ICD-10-CM | POA: Diagnosis not present

## 2022-12-22 DIAGNOSIS — M5416 Radiculopathy, lumbar region: Secondary | ICD-10-CM | POA: Diagnosis not present

## 2022-12-22 DIAGNOSIS — M9904 Segmental and somatic dysfunction of sacral region: Secondary | ICD-10-CM | POA: Diagnosis not present

## 2022-12-29 DIAGNOSIS — M9905 Segmental and somatic dysfunction of pelvic region: Secondary | ICD-10-CM | POA: Diagnosis not present

## 2022-12-29 DIAGNOSIS — M5416 Radiculopathy, lumbar region: Secondary | ICD-10-CM | POA: Diagnosis not present

## 2022-12-29 DIAGNOSIS — M9903 Segmental and somatic dysfunction of lumbar region: Secondary | ICD-10-CM | POA: Diagnosis not present

## 2022-12-29 DIAGNOSIS — M9904 Segmental and somatic dysfunction of sacral region: Secondary | ICD-10-CM | POA: Diagnosis not present

## 2023-01-12 DIAGNOSIS — M9904 Segmental and somatic dysfunction of sacral region: Secondary | ICD-10-CM | POA: Diagnosis not present

## 2023-01-12 DIAGNOSIS — M5416 Radiculopathy, lumbar region: Secondary | ICD-10-CM | POA: Diagnosis not present

## 2023-01-12 DIAGNOSIS — M9905 Segmental and somatic dysfunction of pelvic region: Secondary | ICD-10-CM | POA: Diagnosis not present

## 2023-01-12 DIAGNOSIS — M9903 Segmental and somatic dysfunction of lumbar region: Secondary | ICD-10-CM | POA: Diagnosis not present

## 2023-01-17 IMAGING — CT CT ABD-PELV W/ CM
1 of 3 series · 13 of 32 positions shown, 18 images · IV contrast (agent unspecified)
Comparison: 06/07/2008

CLINICAL DATA: Pain left lower quadrant of abdomen, previous
history of inguinal hernia repair

EXAM:
CT ABDOMEN AND PELVIS WITH CONTRAST
TECHNIQUE: Multidetector CT imaging of the abdomen and pelvis was performed
using the standard protocol following bolus administration of
intravenous contrast.

[Series 2: a/p w/ 5mm · axial · 0.92mm/px · z∈[-497,+3]mm · 13 of 114 slices shown, 18 images]
[im 7/114  soft-tissue]
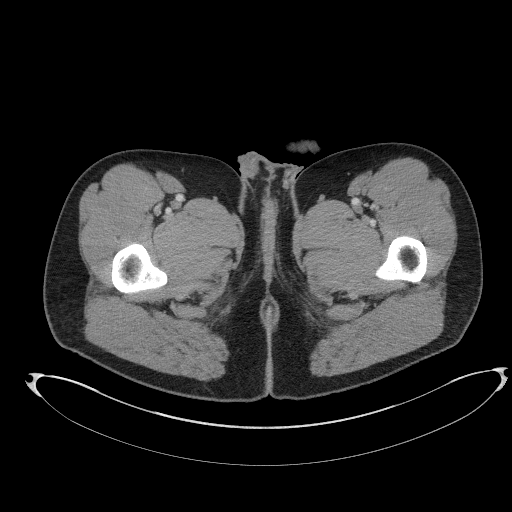
[im 7/114  bone]
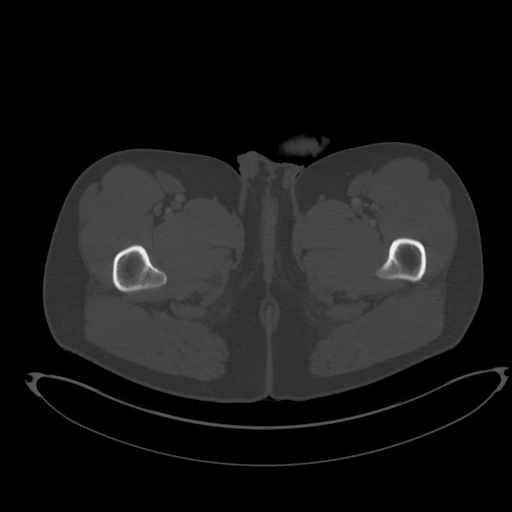
[im 19/114  soft-tissue]
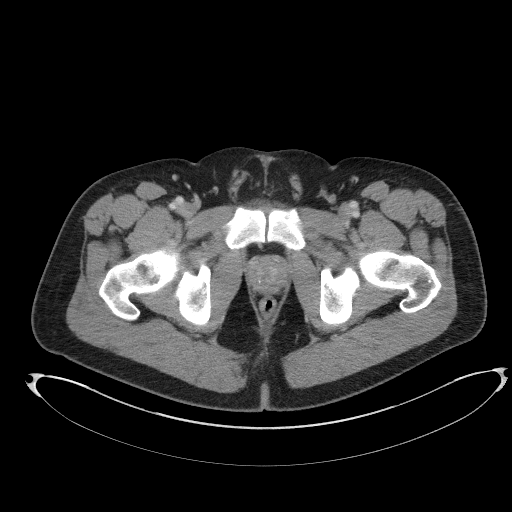
[im 26/114  soft-tissue]
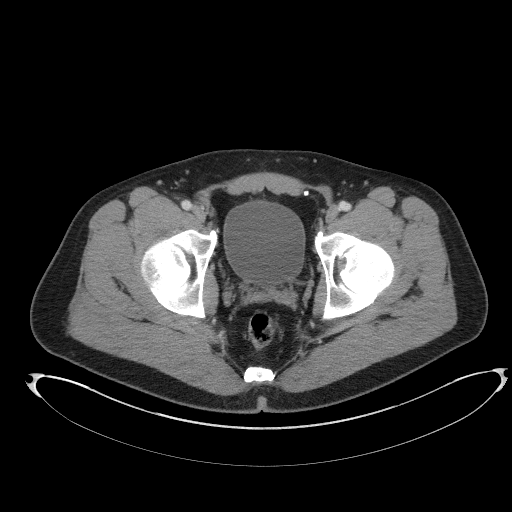
[im 32/114  soft-tissue]
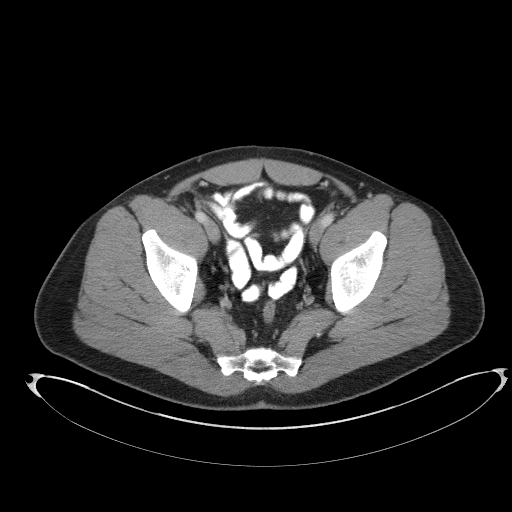
[im 44/114  soft-tissue]
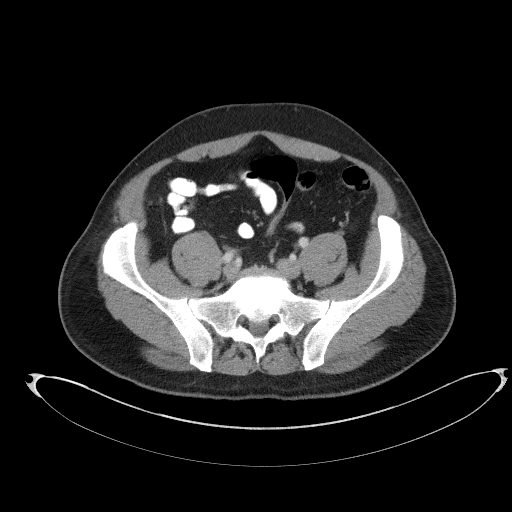
[im 51/114  soft-tissue]
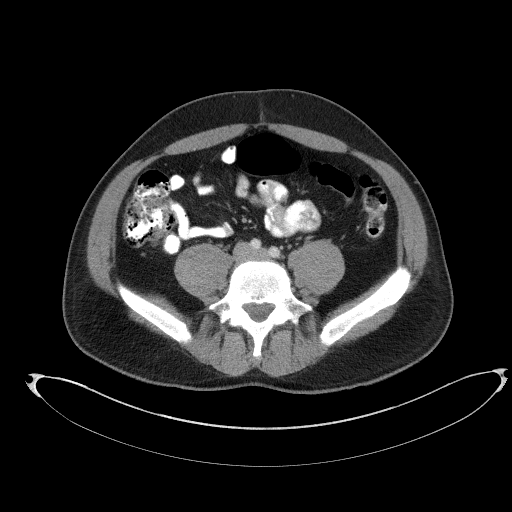
[im 63/114  soft-tissue]
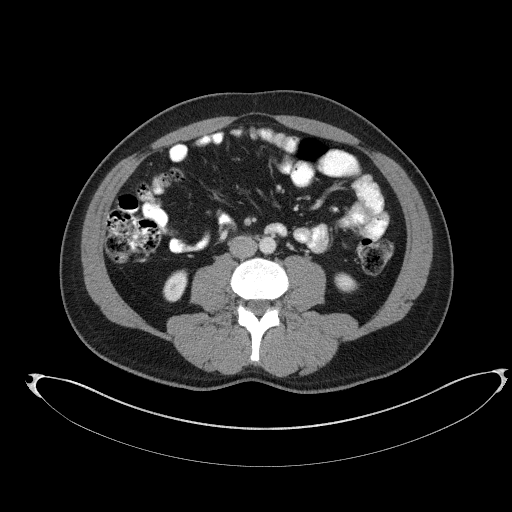
[im 70/114  soft-tissue]
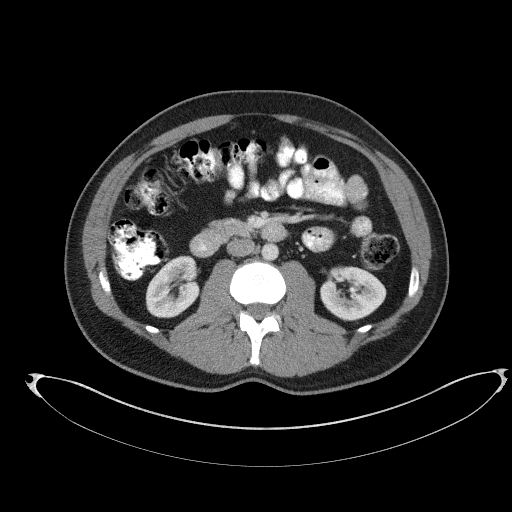
[im 82/114  soft-tissue]
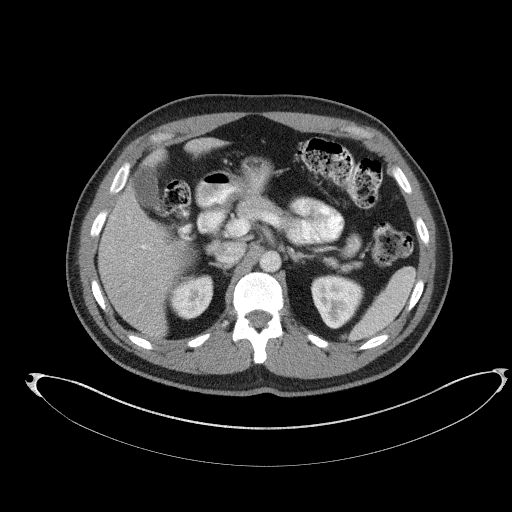
[im 82/114  bone]
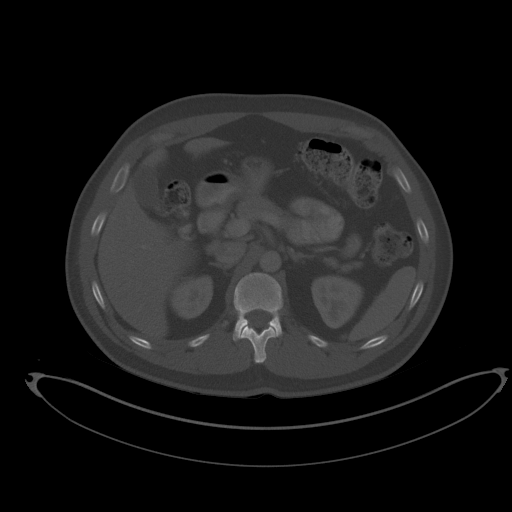
[im 88/114  soft-tissue]
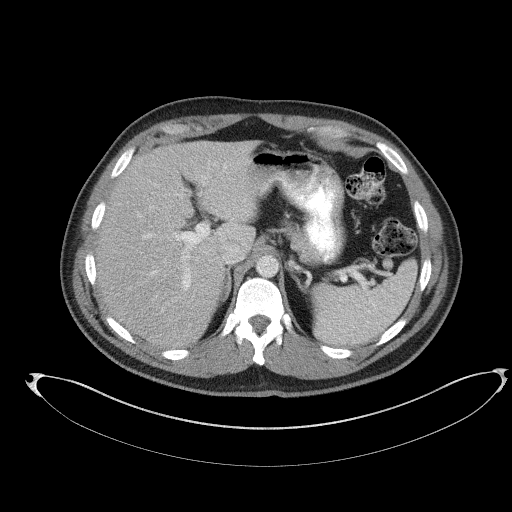
[im 88/114  lung]
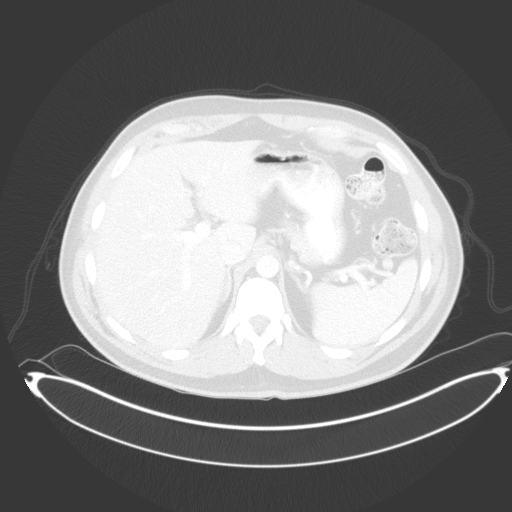
[im 95/114  soft-tissue]
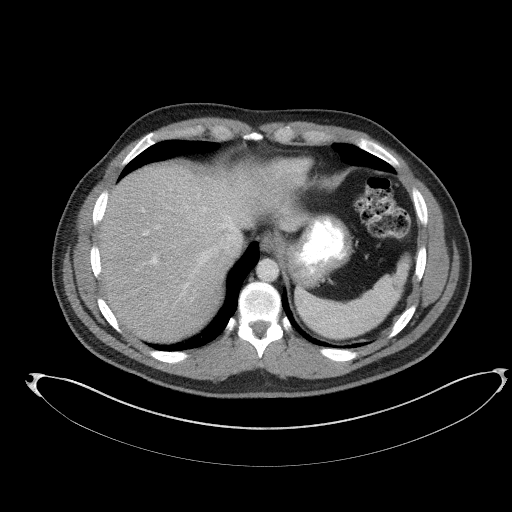
[im 95/114  lung]
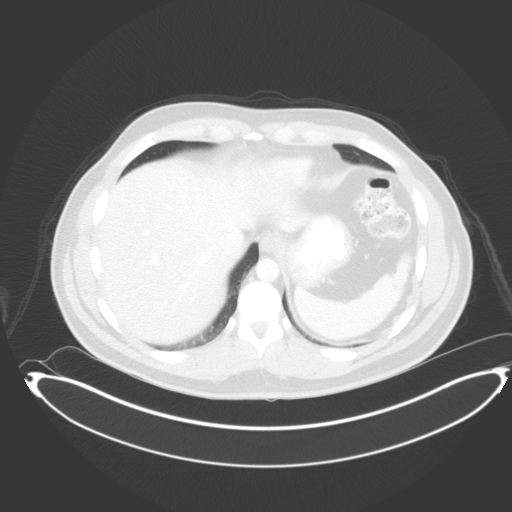
[im 101/114  lung]
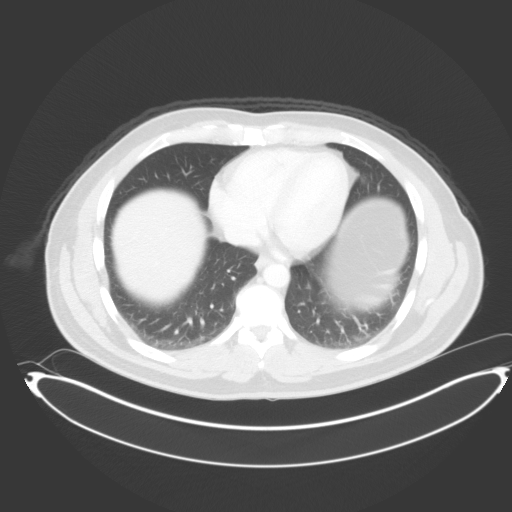
[im 107/114  soft-tissue]
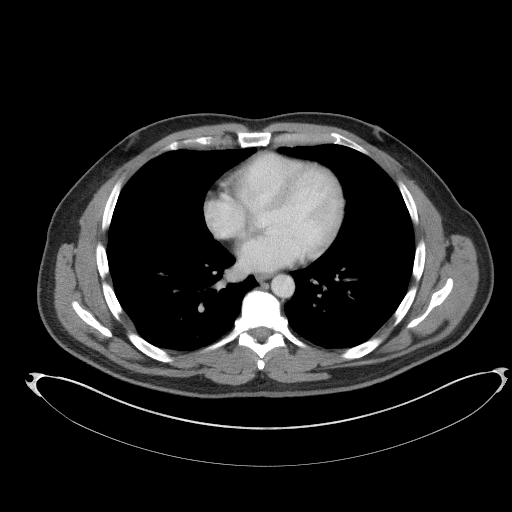
[im 107/114  lung]
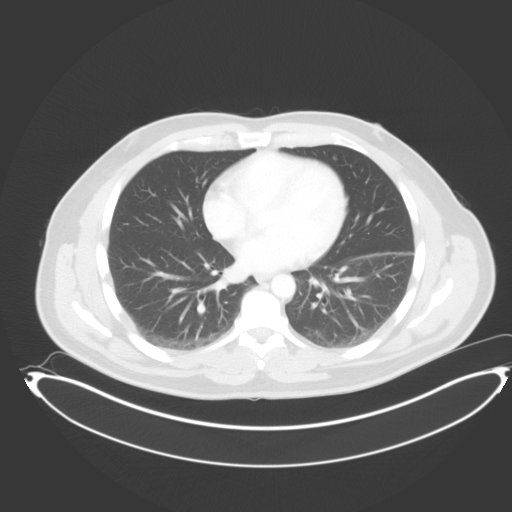

[13 of 32 positions shown; findings below may reference images not displayed]

RADIATION DOSE REDUCTION: This exam was performed according to the
departmental dose-optimization program which includes automated
exposure control, adjustment of the mA and/or kV according to
patient size and/or use of iterative reconstruction technique.

CONTRAST:  100mL [id number] IOPAMIDOL ([id number]) INJECTION 61%
FINDINGS: Lower chest: Left hemidiaphragm is elevated.

Hepatobiliary: No new focal abnormality is seen. Linear low
attenuation seen in the anterior aspect of liver in the image 22 of
series 2 has not changed. There is no dilation of bile ducts.
Gallbladder is unremarkable.

Pancreas: No focal abnormality is seen.

Spleen: Spleen measures 12.6 cm in maximum diameter. There are 2
small low-density foci in the anterior aspect of spleen which may
have been present in the previous study. This may suggest
hemangiomas.

Adrenals/Urinary Tract: Adrenals are unremarkable. There is no
hydronephrosis. Ureters not dilated. Urinary bladder is
unremarkable. There are small calcific densities in the upper and
mid portions of right kidney each measuring less than 3 mm
suggesting nonobstructing renal stones.

Stomach/Bowel: Stomach is unremarkable. Small bowel loops are not
dilated. Appendix is difficult to visualize. In the image 61 of
series 2, there is a small caliber tubular structure posterior to
the cecum, possibly normal appendix. There is no significant wall
thickening in the colon. There is no pericolic stranding.

Vascular/Lymphatic: Unremarkable.

Reproductive: Unremarkable.

Other: There is no ascites or pneumoperitoneum. Small umbilical
hernia containing fat is seen. There are metallic densities in both
inguinal regions suggesting previous bilateral inguinal hernia
repair.

Musculoskeletal: Degenerative changes are noted in the lumbar spine
with disc space narrowing at L4-L5 and L5-S1 levels along with
encroachment of neural foramina, more so at L5-S1 level.
IMPRESSION: There is no evidence of intestinal obstruction or pneumoperitoneum.
There is no hydronephrosis.

Fatty liver. Small nonobstructing right renal stones. Small
low-density lesions in the spleen may suggest hemangiomas. Lumbar
spondylosis, particularly severe at L5-S1 level.

Other findings as described in the body of the report.

## 2023-01-25 DIAGNOSIS — M5416 Radiculopathy, lumbar region: Secondary | ICD-10-CM | POA: Diagnosis not present

## 2023-01-25 DIAGNOSIS — M9905 Segmental and somatic dysfunction of pelvic region: Secondary | ICD-10-CM | POA: Diagnosis not present

## 2023-01-25 DIAGNOSIS — M9903 Segmental and somatic dysfunction of lumbar region: Secondary | ICD-10-CM | POA: Diagnosis not present

## 2023-01-25 DIAGNOSIS — M9904 Segmental and somatic dysfunction of sacral region: Secondary | ICD-10-CM | POA: Diagnosis not present

## 2023-01-28 ENCOUNTER — Ambulatory Visit (INDEPENDENT_AMBULATORY_CARE_PROVIDER_SITE_OTHER)
Admission: RE | Admit: 2023-01-28 | Discharge: 2023-01-28 | Disposition: A | Discharge status: Home / Self Care | Payer: BC Managed Care – PPO | Source: Ambulatory Visit | Attending: Nurse Practitioner | Admitting: Nurse Practitioner

## 2023-01-28 ENCOUNTER — Ambulatory Visit: Payer: BC Managed Care – PPO | Admitting: Nurse Practitioner

## 2023-01-28 ENCOUNTER — Encounter: Payer: Self-pay | Admitting: Nurse Practitioner

## 2023-01-28 VITALS — BP 122/76 | HR 65 | Temp 98.4°F | Resp 16 | Ht 74.0 in | Wt 222.0 lb

## 2023-01-28 DIAGNOSIS — M545 Low back pain, unspecified: Secondary | ICD-10-CM

## 2023-01-28 DIAGNOSIS — M546 Pain in thoracic spine: Secondary | ICD-10-CM | POA: Diagnosis not present

## 2023-01-28 LAB — POC URINALSYSI DIPSTICK (AUTOMATED)
Bilirubin, UA: NEGATIVE
Blood, UA: NEGATIVE
Glucose, UA: NEGATIVE
Ketones, UA: NEGATIVE
Leukocytes, UA: NEGATIVE
Nitrite, UA: NEGATIVE
Protein, UA: NEGATIVE
Spec Grav, UA: 1.015 (ref 1.010–1.025)
Urobilinogen, UA: 0.2 E.U./dL
pH, UA: 7 (ref 5.0–8.0)

## 2023-01-28 MED ORDER — PREDNISONE 20 MG PO TABS: ORAL_TABLET | ORAL | 0 refills | Status: AC

## 2023-01-28 MED ORDER — CYCLOBENZAPRINE HCL 5 MG PO TABS: 5.0000 mg | ORAL_TABLET | Freq: Two times a day (BID) | ORAL | 0 refills | Status: DC | PRN

## 2023-01-28 NOTE — Patient Instructions (Signed)
Nice to see you today I have sent in some prednisone and muscle relaxer. The muscle relaxer can cause sedation so use caution Follow up if you do not improve

## 2023-01-28 NOTE — Assessment & Plan Note (Signed)
Musculoskeletal in nature.  UA negative in office.  No red flag symptoms on physical exam.  Prednisone 20 mg taper as directed prednisone precautions reviewed.  Muscle relaxer cyclobenzaprine 5 mg twice daily as needed sedation precautions reviewed.

## 2023-01-28 NOTE — Assessment & Plan Note (Signed)
Longstanding issue.  Patient states he feels something move sometimes in the back will obtain thoracic spine x-ray.  Prednisone 20 mg taper as directed.  Per prednisone precautions reviewed.  Also use cyclobenzaprine 5 mg twice daily as needed sedation precautions reviewed.

## 2023-01-28 NOTE — Progress Notes (Signed)
Acute Office Visit  Subjective:     Patient ID: Devon Lewis, male    DOB: 07-27-1976, 47 y.o.   MRN: [id number]  Chief Complaint  Patient presents with   Back Pain    Upper X October and lower back X 2 weeks      Patient is in today for back pain with a history of HLD  Upper back: bothering him since October. States that it feels like something moves on the left paraspinal area that results in a sharp pain causing his shoulder to feel weird. That has been 10 years an dhas happened approx 3 times. States that it started back and he feels tight on the back. States when he lays back and relaxers it releives the tension. States that he did she a chiropractor in the past for the left shoulder back. States that he has been going weekly to the chiropractor. 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 Bilateral back pain. States that he did try to take ibuprofen for his knee. Then after that his back started bothering  States that he did try acetaminophen twice over the past 2 weeks  that did not hlep. Sitting it will come. Movement does not make it worse   Review of Systems  Constitutional:  Negative for chills and fever.  Respiratory:  Negative for shortness of breath.   Cardiovascular:  Negative for chest pain.  Musculoskeletal:  Positive for back pain.  Neurological:  Positive for tingling. Negative for focal weakness and weakness.        Objective:    BP 122/76   Pulse 65   Temp 98.4 F (36.9 C)   Resp 16   Ht 6\' 2"  (1.88 m)   Wt 222 lb (100.7 kg)   SpO2 98%   BMI 28.50 kg/m    Physical Exam Vitals and nursing note reviewed.  Constitutional:      Appearance: Normal appearance.  Cardiovascular:     Rate and Rhythm: Normal rate and regular rhythm.     Heart sounds: Normal heart sounds.  Pulmonary:     Effort: Pulmonary effort is normal.     Breath sounds: Normal breath sounds.   Musculoskeletal:       Arms:     Thoracic back: Tenderness present. No bony tenderness. Normal range of motion.     Lumbar back: Tenderness present. No bony tenderness. Normal range of motion. Negative right straight leg raise test and negative left straight leg raise test.       Back:     Right lower leg: No edema.     Left lower leg: No edema.     Comments: Lumbar flexion causes discomfort  Oblique turns and lateral slides cause upper back discomfort   Neurological:     General: No focal deficit present.     Mental Status: He is alert.     Deep Tendon Reflexes:     Reflex Scores:      Bicep reflexes are 2+ on the right side and 2+ on the left side.      Patellar reflexes are 2+ on the right side and 2+ on the left side.    Comments: Bilateral upper and lower extremity strength 5/5     Results for orders placed or performed in visit on 01/28/23  POCT Urinalysis Dipstick (Automated)  Result Value Ref Range  Color, UA yellow    Clarity, UA clear    Glucose, UA Negative Negative   Bilirubin, UA Negative    Ketones, UA Negative    Spec Grav, UA 1.015 1.010 - 1.025   Blood, UA Negative    pH, UA 7.0 5.0 - 8.0   Protein, UA Negative Negative   Urobilinogen, UA 0.2 0.2 or 1.0 E.U./dL   Nitrite, UA Negative    Leukocytes, UA Negative Negative        Assessment & Plan:   Problem List Items Addressed This Visit       Other   Bilateral low back pain without sciatica - Primary    Musculoskeletal in nature.  UA negative in office.  No red flag symptoms on physical exam.  Prednisone 20 mg taper as directed prednisone precautions reviewed.  Muscle relaxer cyclobenzaprine 5 mg twice daily as needed sedation precautions reviewed.      Relevant Medications   predniSONE (DELTASONE) 20 MG tablet   cyclobenzaprine (FLEXERIL) 5 MG tablet   Other Relevant Orders   POCT Urinalysis Dipstick (Automated) (Completed)   Thoracic spine pain    Longstanding issue.  Patient states he  feels something move sometimes in the back will obtain thoracic spine x-ray.  Prednisone 20 mg taper as directed.  Per prednisone precautions reviewed.  Also use cyclobenzaprine 5 mg twice daily as needed sedation precautions reviewed.      Relevant Medications   predniSONE (DELTASONE) 20 MG tablet   cyclobenzaprine (FLEXERIL) 5 MG tablet   Other Relevant Orders   DG Thoracic Spine W/Swimmers    Meds ordered this encounter  Medications   predniSONE (DELTASONE) 20 MG tablet    Sig: Take 2 tablets (40 mg total) by mouth daily with breakfast for 3 days, THEN 1 tablet (20 mg total) daily with breakfast for 3 days. Avoid NSAIDs like: ibuprofen, motrin, aleve, naproxen, BC/Goody powders.    Dispense:  9 tablet    Refill:  0    Order Specific Question:   Supervising Provider    Answer:   Milinda Antis, MARNE A [[id number]]   cyclobenzaprine (FLEXERIL) 5 MG tablet    Sig: Take 1 tablet (5 mg total) by mouth 2 (two) times daily as needed for muscle spasms.    Dispense:  20 tablet    Refill:  0    Order Specific Question:   Supervising Provider    Answer:   Roxy Manns A [[id number]]    Return if symptoms worsen or fail to improve, for As scheduled for CPE.  Audria Nine, NP

## 2023-02-08 DIAGNOSIS — M9904 Segmental and somatic dysfunction of sacral region: Secondary | ICD-10-CM | POA: Diagnosis not present

## 2023-02-08 DIAGNOSIS — M9903 Segmental and somatic dysfunction of lumbar region: Secondary | ICD-10-CM | POA: Diagnosis not present

## 2023-02-08 DIAGNOSIS — M9905 Segmental and somatic dysfunction of pelvic region: Secondary | ICD-10-CM | POA: Diagnosis not present

## 2023-02-08 DIAGNOSIS — M5416 Radiculopathy, lumbar region: Secondary | ICD-10-CM | POA: Diagnosis not present

## 2023-02-14 ENCOUNTER — Telehealth: Payer: Self-pay | Admitting: Nurse Practitioner

## 2023-02-14 NOTE — Telephone Encounter (Signed)
Patient called Devon Lewis give Matt an update on medication cyclobenzaprine (FLEXERIL) 5 MG tablet that was prescribed for his back and shoulder pain. He said that its not really working for him. He would like to know if something else can be prescribed or if he needs to come back Devon Lewis to be reevaluated? Please advise.

## 2023-02-15 NOTE — Telephone Encounter (Signed)
Can we get him on Dr. Jilda Roche scheduled for further evaluation

## 2023-02-15 NOTE — Telephone Encounter (Signed)
Sched OV with Dr. Patsy Lager for 6/13

## 2023-02-16 NOTE — Progress Notes (Signed)
[name], MD, CAQ Sports Medicine [facility] [address] [place], [address]  Phone: [phone]  FAX: [phone]  Devon Lewis - 47 y.o. male  MRN [id number]  Date of Birth: 1975-12-14  Date: 02/17/2023  PCP: [name], NP  Referral: [name], NP  Chief Complaint  Patient presents with   Back Pain   Shoulder Pain   Subjective:   RODDERICK Lewis is a 47 y.o. very pleasant male patient with Body mass index is 28.31 kg/m. who presents with the following:  Patient presents with ongoing upper back and thoracic pain that has been relatively longstanding along with some relatively acute low back pain.  On further questioning, he gives a history of having intermittent radiculopathy in the cervical spine region off and on for about 10 years.  He also has very significant thoracic spine pain and lower cervicals pain.  The low back is relatively acute, and it is not as severe compared to the cervical and thoracic pain.  No lumbar radiculopathy.  He did receive 6-day taper of oral prednisone.  This only helped a little bit.  In October, had some thoracic pain.  Starting ten years ago, had some pain down his left arm, and that is ongoing right now.  Arm has been tingling now.  - has been to the chiropractor.  He has had multiple manipulations and efforts by the chiropractor to help relieve symptoms, and these have been unhelpful.  Tingling quite a bit in his arm on the left side.  No focal numbness.  No weakness.  Sleep has not changed.   Mild tingling some and heavy on the left side.   In October, hurt really bad.  Also hurt with a box of printer paper, when he was lifting this.   I did pull up the patient's CT abdomen and pelvis from March 2023 for my independent review.  Overall, the upper visualized spine has minimal degenerative changes.  There is some degenerative disc disease at L4-5 and L5-S1, is most  pronounced at L5-S1 with what appears to be a very minor amount of anterolisthesis.  Electronically Signed  By: Hannah Beat, MD On: 02/17/2023  3:20 PM EDT   I also pull of the thoracic spine views from Jan 30, 2023, and those are essentially unremarkable with minimal to no significant degenerative joint disease or degenerative disc disease.  Coincides with the visualized portion of the thoracic spine on the CT abdomen pelvis. Electronically Signed  By: Hannah Beat, MD On: 02/17/2023  3:20 PM EDT   Review of Systems is noted in the HPI, as appropriate  Objective:   BP 112/76 (BP Location: Right Arm, Patient Position: Sitting, Cuff Size: Large)   Pulse 92   Temp 98.2 F (36.8 C) (Temporal)   Ht 6\' 2"  (1.88 m)   Wt 220 lb 8 oz (100 kg)   SpO2 97%   BMI 28.31 kg/m   GEN: No acute distress; alert,appropriate. PULM: Breathing comfortably in no respiratory distress PSYCH: Normally interactive.    CERVICAL SPINE EXAM Range of motion: Flexion, extension, lateral bending, and rotation: Mild limitation in forward flexion, extension, and to a lesser degree lateral bending and rotational maneuvers. Pain with terminal motion: Yes, in all directions Spinous Processes: NT SCM: NT Upper paracervical muscles: Tender to palpation on the left Upper traps: Upper traps with tender to palpation on the left C5-T1 intact, sensation and motor  -I do  not appreciate any deficits to soft touch, pinprick, and strength testing.   Range of motion at  the waist: Flexion: normal Extension: normal Lateral bending: normal Rotation: all normal  No echymosis or edema Rises to examination table with no difficulty Gait: non antalgic  Inspection/Deformity: N Paraspinus Tenderness: L4-S1  B Ankle Dorsiflexion (L5,4): 5/5 B Great Toe Dorsiflexion (L5,4): 5/5 Heel Walk (L5): WNL Toe Walk (S1): WNL Rise/Squat (L4): WNL  SENSORY B Medial Foot (L4): WNL B Dorsum (L5): WNL B Lateral (S1): WNL Light  Touch: WNL Pinprick: WNL  REFLEXES Knee (L4): 2+ Ankle (S1): 2+  B SLR, seated: neg B SLR, supine: neg B FABER: neg B Reverse FABER: neg B Greater Troch: NT B Log Roll: neg B Sciatic Notch: NT   Laboratory and Imaging Data: Imaging as above  Assessment and Plan:     ICD-10-CM   1. Cervical radiculopathy  M54.12     2. Thoracic spine pain  M54.6     3. Bilateral low back pain without sciatica, unspecified chronicity  M54.50     4. 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  He has a history of having such off-and-on for 10 years.  While he does have radicular pains, I do not appreciate a deficit in sensation or strength.  I am going to have the patient do a round of 14 days of oral steroids and start gabapentin.  He will also start some McKenzie type protocol.  Thoracic spine is also tender to palpation.  Hopefully the above will help with this, as well.  He has done extensive work with the chiropractor, but if his symptoms persist we can also have him work with physical therapy. -The thoracic spine is preserved with minimal degenerative disc disease visualized on plain x-ray or on the CT of his abdomen and pelvis.  He does have some multilevel degenerative disc disease of the lumbar spine.  No radiculopathy.  At this point, this is relatively minor compared to his other pains.  Medication Management during today's office visit: Meds ordered this encounter  Medications   predniSONE (DELTASONE) 20 MG tablet    Sig: 2 tabs po for 7 days, then 1 tab po for 7 days    Dispense:  21 tablet    Refill:  0   gabapentin (NEURONTIN) 300 MG capsule    Sig: Take 1 capsule (300 mg total) by mouth 3 (three) times daily.    Dispense:  90 capsule    Refill:  3   Medications Discontinued During This Encounter  Medication Reason   fluticasone (FLONASE) 50 MCG/ACT nasal spray Completed  Course   omeprazole (PRILOSEC) 20 MG capsule Completed Course   cyclobenzaprine (FLEXERIL) 5 MG tablet    omeprazole (PRILOSEC) 20 MG capsule Completed Course    Orders placed today for conditions managed today: No orders of the defined types were placed in this encounter.   Disposition: He will follow-up with me by message in 2 to 3 weeks.  If he is having some relief from his gabapentin, we can titrate this upwards to effect.  Dr[employer] One speech-to-text software was used for transcription in this dictation.  Possible transcriptional errors can occur using Animal nutritionist.   Signed,  Elpidio Galea. Dashea Mcmullan, MD   Outpatient Encounter Medications as of 02/17/2023  Medication Sig   Fiber Adult Gummies 2 g CHEW Chew  by mouth daily.   gabapentin (NEURONTIN) 300 MG capsule Take 1 capsule (300 mg total) by mouth 3 (three) times daily.   predniSONE (DELTASONE) 20 MG tablet 2 tabs po for 7 days, then 1 tab po for 7 days   [DISCONTINUED] cyclobenzaprine (FLEXERIL) 5 MG tablet Take 1 tablet (5 mg total) by mouth 2 (two) times daily as needed for muscle spasms. (Patient not taking: Reported on 02/17/2023)   [DISCONTINUED] fluticasone (FLONASE) 50 MCG/ACT nasal spray Place 2 sprays into both nostrils daily. (Patient not taking: Reported on 01/28/2023)   [DISCONTINUED] omeprazole (PRILOSEC) 20 MG capsule Take 1 capsule (20 mg total) by mouth daily. (Patient not taking: Reported on 01/28/2023)   No facility-administered encounter medications on file as of 02/17/2023.

## 2023-02-17 ENCOUNTER — Encounter: Payer: Self-pay | Admitting: Family Medicine

## 2023-02-17 ENCOUNTER — Ambulatory Visit: Payer: BC Managed Care – PPO | Admitting: Family Medicine

## 2023-02-17 VITALS — BP 112/76 | HR 92 | Temp 98.2°F | Ht 74.0 in | Wt 220.5 lb

## 2023-02-17 DIAGNOSIS — M545 Low back pain, unspecified: Secondary | ICD-10-CM | POA: Diagnosis not present

## 2023-02-17 DIAGNOSIS — M546 Pain in thoracic spine: Secondary | ICD-10-CM

## 2023-02-17 DIAGNOSIS — M5412 Radiculopathy, cervical region: Secondary | ICD-10-CM

## 2023-02-17 DIAGNOSIS — M5137 Other intervertebral disc degeneration, lumbosacral region: Secondary | ICD-10-CM | POA: Diagnosis not present

## 2023-02-17 MED ORDER — GABAPENTIN 300 MG PO CAPS: 300.0000 mg | ORAL_CAPSULE | Freq: Three times a day (TID) | ORAL | 3 refills | Status: DC

## 2023-02-17 MED ORDER — PREDNISONE 20 MG PO TABS: ORAL_TABLET | ORAL | 0 refills | Status: DC

## 2023-02-17 NOTE — Patient Instructions (Signed)
For 2 days, take 1 at night  Then increase the dose to 1 capsule twice a day  Then after 2 more days, increase to 1 capsule 3 times a day

## 2023-03-08 DIAGNOSIS — M9904 Segmental and somatic dysfunction of sacral region: Secondary | ICD-10-CM | POA: Diagnosis not present

## 2023-03-08 DIAGNOSIS — M9903 Segmental and somatic dysfunction of lumbar region: Secondary | ICD-10-CM | POA: Diagnosis not present

## 2023-03-08 DIAGNOSIS — M5416 Radiculopathy, lumbar region: Secondary | ICD-10-CM | POA: Diagnosis not present

## 2023-03-08 DIAGNOSIS — M9905 Segmental and somatic dysfunction of pelvic region: Secondary | ICD-10-CM | POA: Diagnosis not present

## 2023-04-12 DIAGNOSIS — M9903 Segmental and somatic dysfunction of lumbar region: Secondary | ICD-10-CM | POA: Diagnosis not present

## 2023-04-12 DIAGNOSIS — M9904 Segmental and somatic dysfunction of sacral region: Secondary | ICD-10-CM | POA: Diagnosis not present

## 2023-04-12 DIAGNOSIS — M5416 Radiculopathy, lumbar region: Secondary | ICD-10-CM | POA: Diagnosis not present

## 2023-04-12 DIAGNOSIS — M9905 Segmental and somatic dysfunction of pelvic region: Secondary | ICD-10-CM | POA: Diagnosis not present

## 2023-05-17 DIAGNOSIS — M9905 Segmental and somatic dysfunction of pelvic region: Secondary | ICD-10-CM | POA: Diagnosis not present

## 2023-05-17 DIAGNOSIS — M5416 Radiculopathy, lumbar region: Secondary | ICD-10-CM | POA: Diagnosis not present

## 2023-05-17 DIAGNOSIS — M9904 Segmental and somatic dysfunction of sacral region: Secondary | ICD-10-CM | POA: Diagnosis not present

## 2023-05-17 DIAGNOSIS — M9903 Segmental and somatic dysfunction of lumbar region: Secondary | ICD-10-CM | POA: Diagnosis not present

## 2023-06-28 ENCOUNTER — Encounter: Payer: BC Managed Care – PPO | Admitting: Nurse Practitioner

## 2023-06-29 ENCOUNTER — Encounter: Payer: Self-pay | Admitting: Nurse Practitioner

## 2023-09-23 ENCOUNTER — Encounter: Payer: Self-pay | Admitting: Nurse Practitioner

## 2023-10-19 ENCOUNTER — Ambulatory Visit (INDEPENDENT_AMBULATORY_CARE_PROVIDER_SITE_OTHER): Payer: 59 | Admitting: Nurse Practitioner

## 2023-10-19 ENCOUNTER — Encounter: Payer: Self-pay | Admitting: Nurse Practitioner

## 2023-10-19 VITALS — BP 114/70 | HR 67 | Temp 98.2°F | Ht 72.5 in | Wt 225.0 lb

## 2023-10-19 DIAGNOSIS — Z131 Encounter for screening for diabetes mellitus: Secondary | ICD-10-CM

## 2023-10-19 DIAGNOSIS — E7800 Pure hypercholesterolemia, unspecified: Secondary | ICD-10-CM

## 2023-10-19 DIAGNOSIS — E669 Obesity, unspecified: Secondary | ICD-10-CM | POA: Diagnosis not present

## 2023-10-19 DIAGNOSIS — Z0000 Encounter for general adult medical examination without abnormal findings: Secondary | ICD-10-CM

## 2023-10-19 NOTE — Patient Instructions (Signed)
Nice to see you today  I will be in touch with the labs once I review them Try using knee sleeves while at work and taking them off at night Follow up with me in 1 year. Sooner if you need me

## 2023-10-19 NOTE — Assessment & Plan Note (Signed)
Pending lipid panel. Continue working on lifestyle modifications

## 2023-10-19 NOTE — Assessment & Plan Note (Signed)
Pending A1C, Lipid, TSH. Continue working on lifestyle modifications

## 2023-10-19 NOTE — Progress Notes (Signed)
Established Patient Office Visit  Subjective   Patient ID: Devon Lewis, male    DOB: 06-26-1976  Age: 48 y.o. MRN: [id number]  Chief Complaint  Patient presents with   Annual Exam    HPI   for complete physical and follow up of chronic conditions.   Radicular pian : was seen by Dr. Patsy Lager and was prescribed gabapentin and is not currenlty taking   Immunizations: -Tetanus: Completed in 2023 -Influenza: refuesed  -Shingles: Too young -Pneumonia: Too young  Diet: Fair diet. 3 meals a day. Not a snacker. He iwll drink sweet tea and sometimes sodas. Has been cutting back on the sodas Exercise: No regular exercise. Walking.  Eye exam: PRN Dental exam: Completes semi-annually    Colonoscopy: Completed in 04/05/2022 Lung Cancer Screening: N/A  PSA: Too young, currently average risk  Sleep: he will go to bed 1030-620. Feels rested sometimes. He use to get up later but with the job change. He will snore with laying on the back       Review of Systems  Constitutional:  Negative for chills and fever.  Respiratory:  Negative for shortness of breath.   Cardiovascular:  Negative for chest pain and leg swelling.  Gastrointestinal:  Negative for abdominal pain, blood in stool, constipation, diarrhea, nausea and vomiting.       BM daily   Genitourinary:  Negative for dysuria and hematuria.  Neurological:  Negative for tingling and headaches.  Psychiatric/Behavioral:  Negative for hallucinations and suicidal ideas.       Objective:     BP 114/70   Pulse 67   Temp 98.2 F (36.8 C) (Oral)   Ht 6' 0.5" (1.842 m)   Wt 225 lb (102.1 kg)   SpO2 98%   BMI 30.10 kg/m  BP Readings from Last 3 Encounters:  10/19/23 114/70  02/17/23 112/76  01/28/23 122/76   Wt Readings from Last 3 Encounters:  10/19/23 225 lb (102.1 kg)  02/17/23 220 lb 8 oz (100 kg)  01/28/23 222 lb (100.7 kg)   SpO2 Readings from Last 3 Encounters:  10/19/23 98%  02/17/23 97%  01/28/23 98%       Physical Exam Vitals and nursing note reviewed.  Constitutional:      Appearance: Normal appearance.  HENT:     Right Ear: Tympanic membrane, ear canal and external ear normal.     Left Ear: Tympanic membrane, ear canal and external ear normal.     Mouth/Throat:     Mouth: Mucous membranes are moist.     Pharynx: Oropharynx is clear.  Eyes:     Extraocular Movements: Extraocular movements intact.     Pupils: Pupils are equal, round, and reactive to light.  Cardiovascular:     Rate and Rhythm: Normal rate and regular rhythm.     Pulses: Normal pulses.     Heart sounds: Normal heart sounds.  Pulmonary:     Effort: Pulmonary effort is normal.     Breath sounds: Normal breath sounds.  Abdominal:     General: Bowel sounds are normal. There is no distension.     Palpations: There is no mass.     Tenderness: There is no abdominal tenderness.     Hernia: No hernia is present.  Musculoskeletal:     Right lower leg: No edema.     Left lower leg: No edema.  Lymphadenopathy:     Cervical: No cervical adenopathy.  Skin:    General: Skin is warm.  Neurological:     General: No focal deficit present.     Mental Status: He is alert.     Deep Tendon Reflexes:     Reflex Scores:      Bicep reflexes are 2+ on the right side and 2+ on the left side.      Patellar reflexes are 2+ on the right side and 2+ on the left side.    Comments: Bilateral upper and lower extremity strength 5/5  Psychiatric:        Mood and Affect: Mood normal.        Behavior: Behavior normal.        Thought Content: Thought content normal.        Judgment: Judgment normal.      No results found for any visits on 10/19/23.    The 10-year ASCVD risk score (Arnett DK, et al., 2019) is: 2.6%    Assessment & Plan:   Problem List Items Addressed This Visit       Other   Mild hypercholesterolemia   Pending lipid panel. Continue working on lifestyle modifications       Relevant Orders   Lipid panel    Preventative health care - Primary   Discussed age appropriate screening exams and immunization. Did review patients personal, surgical, social, and family history. Patient is up to date on all age appropriate vaccines that he would like. He refused flu vaccines.  Patient is up-to-date on CRC screening.  Too young for PSA for prostate cancer screening.  Patient was given information at discharge about preventative healthcare maintenance with anticipatory guidance.      Relevant Orders   TSH   Comprehensive metabolic panel   CBC   Obesity (BMI 30-39.9)   Pending A1C, Lipid, TSH. Continue working on lifestyle modifications       Relevant Orders   Lipid panel   TSH   Hemoglobin A1c   Other Visit Diagnoses       Screening for diabetes mellitus       Relevant Orders   Hemoglobin A1c       Return in about 1 year (around 10/18/2024) for CPE and Labs.    Audria Nine, NP

## 2023-10-19 NOTE — Assessment & Plan Note (Addendum)
Discussed age appropriate screening exams and immunization. Did review patients personal, surgical, social, and family history. Patient is up to date on all age appropriate vaccines that he would like. He refused flu vaccines.  Patient is up-to-date on CRC screening.  Too young for PSA for prostate cancer screening.  Patient was given information at discharge about preventative healthcare maintenance with anticipatory guidance.

## 2023-10-20 LAB — COMPREHENSIVE METABOLIC PANEL
ALT: 18 U/L (ref 0–53)
AST: 16 U/L (ref 0–37)
Albumin: 4.4 g/dL (ref 3.5–5.2)
Alkaline Phosphatase: 64 U/L (ref 39–117)
BUN: 16 mg/dL (ref 6–23)
CO2: 31 meq/L (ref 19–32)
Calcium: 9.1 mg/dL (ref 8.4–10.5)
Chloride: 102 meq/L (ref 96–112)
Creatinine, Ser: 1.1 mg/dL (ref 0.40–1.50)
GFR: 79.69 mL/min (ref >60.00)
Glucose, Bld: 80 mg/dL (ref 70–99)
Potassium: 4.4 meq/L (ref 3.5–5.1)
Sodium: 141 meq/L (ref 135–145)
Total Bilirubin: 0.5 mg/dL (ref 0.2–1.2)
Total Protein: 7.1 g/dL (ref 6.0–8.3)

## 2023-10-20 LAB — LIPID PANEL
Cholesterol: 192 mg/dL (ref 0–200)
HDL: 39.4 mg/dL (ref >39.00)
LDL Cholesterol: 107 mg/dL — ABNORMAL HIGH (ref 0–99)
NonHDL: 153.08
Total CHOL/HDL Ratio: 5
Triglycerides: 229 mg/dL — ABNORMAL HIGH (ref 0.0–149.0)
VLDL: 45.8 mg/dL — ABNORMAL HIGH (ref 0.0–40.0)

## 2023-10-20 LAB — CBC
HCT: 45 % (ref 39.0–52.0)
Hemoglobin: 15.5 g/dL (ref 13.0–17.0)
MCHC: 34.5 g/dL (ref 30.0–36.0)
MCV: 88.4 fL (ref 78.0–100.0)
Platelets: 282 10*3/uL (ref 150.0–400.0)
RBC: 5.09 Mil/uL (ref 4.22–5.81)
RDW: 12.9 % (ref 11.5–15.5)
WBC: 6 10*3/uL (ref 4.0–10.5)

## 2023-10-20 LAB — TSH: TSH: 1.52 u[IU]/mL (ref 0.35–5.50)

## 2023-10-20 LAB — HEMOGLOBIN A1C: Hgb A1c MFr Bld: 5.3 % (ref 4.6–6.5)

## 2023-10-24 ENCOUNTER — Encounter: Payer: Self-pay | Admitting: Nurse Practitioner

## 2024-07-06 ENCOUNTER — Other Ambulatory Visit: Payer: Self-pay | Admitting: Nurse Practitioner

## 2024-07-06 MED ORDER — GABAPENTIN 300 MG PO CAPS: 300.0000 mg | ORAL_CAPSULE | Freq: Three times a day (TID) | ORAL | 3 refills | Status: AC

## 2024-07-06 NOTE — Telephone Encounter (Signed)
 Copied from CRM #[id number]. Topic: Clinical - Medication Refill >> Jul 06, 2024 11:14 AM Nessti S wrote: Medication: gabapentin  (NEURONTIN ) 300 MG capsule  Has the patient contacted their pharmacy? No (Agent: If no, request that the patient contact the pharmacy for the refill. If patient does not wish to contact the pharmacy document the reason why and proceed with request.) (Agent: If yes, when and what did the pharmacy advise?)  This is the patient's preferred pharmacy:  [facility] [address] [place], [place] - [address] [place] [address] Phone: [phone] Fax: [phone]  Is this the correct pharmacy for this prescription? Yes If no, delete pharmacy and type the correct one.   Has the prescription been filled recently? No  Is the patient out of the medication? Yes  Has the patient been seen for an appointment in the last year OR does the patient have an upcoming appointment? Yes  Can we respond through MyChart? Yes  Agent: Please be advised that Rx refills may take up to 3 business days. We ask that you follow-up with your pharmacy.

## 2024-07-10 ENCOUNTER — Ambulatory Visit: Admitting: Nurse Practitioner

## 2024-07-10 ENCOUNTER — Ambulatory Visit (INDEPENDENT_AMBULATORY_CARE_PROVIDER_SITE_OTHER)
Admission: RE | Admit: 2024-07-10 | Discharge: 2024-07-10 | Disposition: A | Discharge status: Home / Self Care | Source: Ambulatory Visit | Attending: Nurse Practitioner | Admitting: Nurse Practitioner

## 2024-07-10 VITALS — BP 112/80 | HR 66 | Temp 97.9°F | Ht 72.5 in | Wt 227.0 lb

## 2024-07-10 DIAGNOSIS — M5412 Radiculopathy, cervical region: Secondary | ICD-10-CM

## 2024-07-10 MED ORDER — PREDNISONE 10 MG (21) PO TBPK: ORAL_TABLET | ORAL | 0 refills | Status: AC

## 2024-07-10 NOTE — Progress Notes (Signed)
 Established Patient Office Visit  Subjective   Patient ID: Devon Lewis, male    DOB: 1976-08-09  Age: 48 y.o. MRN: [id number]  Chief Complaint  Patient presents with   back, neck, shoulder pain    Pt complains of upper back pain that radiates to his left shoulder and down his arm. Pt states his left fingertips feel numb. Ongoing for years but over the last 2 weeks symptoms have worsened.     HPI  Discussed the use of AI scribe software for clinical note transcription with the patient, who gave verbal consent to proceed.  History of Present Illness Devon Lewis is a 48 year old male who presents with worsening left upper back pain radiating down the arm.  He has been experiencing left upper back pain that has worsened over the past two weeks, with radiation down the arm. The pain varies with shoulder position and sometimes feels like something is moving. He has had similar intermittent pain over the past few years.  The pain is sharp and affected by head and shoulder movements, sometimes causing tingling in the fingertips, particularly in the adjacent fingers, not so much the thumb. No recent injury or weakness in the arm or hand. He reports that certain movements, such as driving or sleeping in certain positions, exacerbate the pain.  He has a history of seeing a chiropractor last year for similar symptoms without significant relief and has not visited the chiropractor in the past year. He uses gabapentin , recently refilled, and ibuprofen for pain management, with minimal relief.  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  No fever, chills, weakness, chest pain, or shortness of breath.      Review of Systems  Constitutional:  Negative for chills and fever.  Respiratory:  Negative for shortness of breath.    Cardiovascular:  Negative for chest pain.  Musculoskeletal:  Positive for joint pain.  Neurological:  Positive for tingling. Negative for weakness.  Psychiatric/Behavioral:  Negative for hallucinations and suicidal ideas.       Objective:     BP 112/80   Pulse 66   Temp 97.9 F (36.6 C) (Oral)   Ht 6' 0.5 (1.842 m)   Wt 227 lb (103 kg)   SpO2 98%   BMI 30.36 kg/m  BP Readings from Last 3 Encounters:  07/10/24 112/80  10/19/23 114/70  02/17/23 112/76   Wt Readings from Last 3 Encounters:  07/10/24 227 lb (103 kg)  10/19/23 225 lb (102.1 kg)  02/17/23 220 lb 8 oz (100 kg)   SpO2 Readings from Last 3 Encounters:  07/10/24 98%  10/19/23 98%  02/17/23 97%      Physical Exam Vitals and nursing note reviewed.  Constitutional:      Appearance: Normal appearance.  Cardiovascular:     Rate and Rhythm: Normal rate and regular rhythm.     Heart sounds: Normal heart sounds.  Pulmonary:     Effort: Pulmonary effort is normal.     Breath sounds: Normal breath sounds.  Musculoskeletal:        General: Tenderness present.     Cervical back: No tenderness or bony tenderness. Normal range of motion.     Thoracic back: Tenderness present. No bony tenderness.       Back:     Comments: Worse with cervical  ROM. Left sided movements   Neurological:     Mental Status: He is alert.   ;   No results found for any visits on 07/10/24.    The 10-year ASCVD risk score (Arnett DK, et al., 2019) is: 2.7%    Assessment & Plan:   Problem List Items Addressed This Visit   None Visit Diagnoses       Cervical radiculopathy    -  Primary   Relevant Medications   predniSONE  (STERAPRED UNI-PAK 21 TAB) 10 MG (21) TBPK tablet   Other Relevant Orders   DG Cervical Spine Complete   DG Shoulder Right   DG Shoulder Left      Assessment and Plan Assessment & Plan Cervical radiculopathy with left upper back and arm pain Chronic cervical radiculopathy with recent exacerbation.  Symptoms suggest cervical spine nerve impingement. Previous treatments ineffective. No weakness or significant functional impairment. - Order cervical spine X-ray for alignment and impingement assessment. - Prescribe high-dose steroid taper pack (6-day) to reduce inflammation. - Advise against ibuprofen; recommend acetaminophen for pain. - Provide at-home neck exercises post-steroid treatment. - Discuss potential MRI if symptoms persist. - Arrange follow-up for X-ray review and treatment response.   Return in about 4 months (around 11/07/2024) for CPE and Labs.    Adina Crandall, NP

## 2024-07-10 NOTE — Patient Instructions (Signed)
 Nice to see you today I will be in touch with the xrays once I have them Follow up if you do not improve Avoid NSAIDS like Ibuprofen, Motrin, Aleve, Naproxen, BC/Goody powders while on the prednisone  Follow up with me in approx

## 2024-07-13 ENCOUNTER — Ambulatory Visit: Admitting: Nurse Practitioner

## 2024-07-16 ENCOUNTER — Ambulatory Visit: Payer: Self-pay | Admitting: Nurse Practitioner
